# Patient Record
Sex: Female | Born: 1950 | Race: White | Hispanic: No | Marital: Married | Smoking: Never smoker
Health system: Southern US, Community
[De-identification: ages and names within clinical notes are randomized; demographics above are authoritative.]

## PROBLEM LIST (undated history)

## (undated) DIAGNOSIS — K219 Gastro-esophageal reflux disease without esophagitis: Secondary | ICD-10-CM

## (undated) DIAGNOSIS — O9989 Other specified diseases and conditions complicating pregnancy, childbirth and the puerperium: Secondary | ICD-10-CM

## (undated) DIAGNOSIS — E079 Disorder of thyroid, unspecified: Secondary | ICD-10-CM

## (undated) DIAGNOSIS — M329 Systemic lupus erythematosus, unspecified: Secondary | ICD-10-CM

## (undated) DIAGNOSIS — G4730 Sleep apnea, unspecified: Secondary | ICD-10-CM

## (undated) DIAGNOSIS — R112 Nausea with vomiting, unspecified: Secondary | ICD-10-CM

## (undated) DIAGNOSIS — Z9889 Other specified postprocedural states: Secondary | ICD-10-CM

## (undated) DIAGNOSIS — O99891 Other specified diseases and conditions complicating pregnancy: Secondary | ICD-10-CM

## (undated) DIAGNOSIS — H269 Unspecified cataract: Secondary | ICD-10-CM

## (undated) HISTORY — PX: CHOLECYSTECTOMY: SHX55

## (undated) HISTORY — PX: GASTRIC BYPASS: SHX52

## (undated) HISTORY — PX: COLONOSCOPY: SHX174

## (undated) HISTORY — PX: TUBAL LIGATION: SHX77

## (undated) HISTORY — PX: THYROIDECTOMY: SHX17

---

## 2013-11-10 ENCOUNTER — Other Ambulatory Visit (HOSPITAL_COMMUNITY): Payer: Self-pay

## 2013-11-10 ENCOUNTER — Other Ambulatory Visit (HOSPITAL_COMMUNITY): Payer: Self-pay | Admitting: Orthopaedic Surgery

## 2013-11-16 ENCOUNTER — Encounter (HOSPITAL_COMMUNITY): Payer: Self-pay | Admitting: Pharmacy Technician

## 2013-11-18 ENCOUNTER — Encounter (HOSPITAL_COMMUNITY): Payer: Self-pay

## 2013-11-18 ENCOUNTER — Encounter (HOSPITAL_COMMUNITY)
Admission: RE | Admit: 2013-11-18 | Discharge: 2013-11-18 | Disposition: A | Discharge status: Home / Self Care | Payer: BC Managed Care – PPO | Source: Ambulatory Visit | Attending: Orthopaedic Surgery | Admitting: Orthopaedic Surgery

## 2013-11-18 DIAGNOSIS — Z01812 Encounter for preprocedural laboratory examination: Secondary | ICD-10-CM | POA: Insufficient documentation

## 2013-11-18 DIAGNOSIS — Z01818 Encounter for other preprocedural examination: Secondary | ICD-10-CM | POA: Insufficient documentation

## 2013-11-18 HISTORY — DX: Disorder of thyroid, unspecified: E07.9

## 2013-11-18 HISTORY — DX: Unspecified cataract: H26.9

## 2013-11-18 HISTORY — DX: Other specified diseases and conditions complicating pregnancy: O99.891

## 2013-11-18 HISTORY — DX: Gastro-esophageal reflux disease without esophagitis: K21.9

## 2013-11-18 HISTORY — DX: Nausea with vomiting, unspecified: R11.2

## 2013-11-18 HISTORY — DX: Other specified diseases and conditions complicating pregnancy, childbirth and the puerperium: O99.89

## 2013-11-18 HISTORY — DX: Other specified postprocedural states: Z98.890

## 2013-11-18 HISTORY — DX: Systemic lupus erythematosus, unspecified: M32.9

## 2013-11-18 HISTORY — DX: Sleep apnea, unspecified: G47.30

## 2013-11-18 LAB — URINALYSIS, ROUTINE W REFLEX MICROSCOPIC
BILIRUBIN URINE: NEGATIVE
Glucose, UA: NEGATIVE mg/dL
Hgb urine dipstick: NEGATIVE
KETONES UR: NEGATIVE mg/dL
Leukocytes, UA: NEGATIVE
Nitrite: POSITIVE — AB
PH: 6 (ref 5.0–8.0)
Protein, ur: NEGATIVE mg/dL
Specific Gravity, Urine: 1.013 (ref 1.005–1.030)
Urobilinogen, UA: 1 mg/dL (ref 0.0–1.0)

## 2013-11-18 LAB — TYPE AND SCREEN
ABO/RH(D): A NEG
Antibody Screen: NEGATIVE

## 2013-11-18 LAB — BASIC METABOLIC PANEL
Anion gap: 13 (ref 5–15)
BUN: 13 mg/dL (ref 6–23)
CO2: 26 meq/L (ref 19–32)
Calcium: 9.2 mg/dL (ref 8.4–10.5)
Chloride: 103 mEq/L (ref 96–112)
Creatinine, Ser: 0.79 mg/dL (ref 0.50–1.10)
GFR calc Af Amer: >90 mL/min (ref >90)
GFR calc non Af Amer: 88 mL/min — ABNORMAL LOW (ref >90)
Glucose, Bld: 82 mg/dL (ref 70–99)
Potassium: 4.4 mEq/L (ref 3.7–5.3)
SODIUM: 142 meq/L (ref 137–147)

## 2013-11-18 LAB — CBC
HCT: 36 % (ref 36.0–46.0)
Hemoglobin: 11.7 g/dL — ABNORMAL LOW (ref 12.0–15.0)
MCH: 29.4 pg (ref 26.0–34.0)
MCHC: 32.5 g/dL (ref 30.0–36.0)
MCV: 90.5 fL (ref 78.0–100.0)
Platelets: 224 10*3/uL (ref 150–400)
RBC: 3.98 MIL/uL (ref 3.87–5.11)
RDW: 13.1 % (ref 11.5–15.5)
WBC: 8 10*3/uL (ref 4.0–10.5)

## 2013-11-18 LAB — URINE MICROSCOPIC-ADD ON

## 2013-11-18 LAB — APTT: aPTT: 35 seconds (ref 24–37)

## 2013-11-18 LAB — PROTIME-INR
INR: 1.06 (ref 0.00–1.49)
Prothrombin Time: 13.8 seconds (ref 11.6–15.2)

## 2013-11-18 LAB — SURGICAL PCR SCREEN
MRSA, PCR: NEGATIVE
Staphylococcus aureus: POSITIVE — AB

## 2013-11-18 LAB — ABO/RH: ABO/RH(D): A NEG

## 2013-11-18 NOTE — Progress Notes (Signed)
Pt denies SOB, chest pain, and being under the care of a cardiologist. Pt denies having a chest x ray and EKG within the last year. Pt denies having a stress test, echo, and cardiac cath. Pt PCP is Dr. Thresa RossPaul Bailey at [facility]. Spoke with Silva BandyKristi at Dr. Eliberto IvoryBlackman's office to make MD aware of abnormal UA.

## 2013-11-18 NOTE — Pre-Procedure Instructions (Addendum)
[name] Small  11/18/2013   Your procedure is scheduled on: Thursday, November 24, 2013  Report to [facility] Admitting at 8:00 AM.  Call this number if you have problems the morning of surgery: [phone]   Remember:   Do not eat food or drink liquids after midnight Wednesday, November 23, 2013   Take these medicines the morning of surgery with A SIP OF WATER: gabapentin (NEURONTIN),   hydroxychloroquine (PLAQUENIL), ranitidine (ZANTAC), synthroid Stop taking Aspirin, vitamins, and herbal medications. Do not take any NSAIDs ie: Ibuprofen, Advil, Naproxen or any medication containing Aspirin;stop 7 days prior to procedure.  Do not wear jewelry, make-up or nail polish.  Do not wear lotions, powders, or perfumes. You may wear deodorant.  Do not shave 48 hours prior to surgery.  Do not bring valuables to the hospital.  [facility] is not responsible for any belongings or valuables.               Contacts, dentures or bridgework may not be worn into surgery.  Leave suitcase in the car. After surgery it may be brought to your room.  For patients admitted to the hospital, discharge time is determined by your treatment team.               Patients discharged the day of surgery will not be allowed to drive home.  Name and phone number of your driver:   Special Instructions:  Special Instructions:Special Instructions: [facility] - Preparing for Surgery  Before surgery, you can play an important role.  Because skin is not sterile, your skin needs to be as free of germs as possible.  You can reduce the number of germs on you skin by washing with CHG (chlorahexidine gluconate) soap before surgery.  CHG is an antiseptic cleaner which kills germs and bonds with the skin to continue killing germs even after washing.  Please DO NOT use if you have an allergy to CHG or antibacterial soaps.  If your skin becomes reddened/irritated stop using the CHG and inform your nurse when you arrive at Short  Stay.  Do not shave (including legs and underarms) for at least 48 hours prior to the first CHG shower.  You may shave your face.  Please follow these instructions carefully:   1.  Shower with CHG Soap the night before surgery and the morning of Surgery.  2.  If you choose to wash your hair, wash your hair first as usual with your normal shampoo.  3.  After you shampoo, rinse your hair and body thoroughly to remove the Shampoo.  4.  Use CHG as you would any other liquid soap.  You can apply chg directly  to the skin and wash gently with scrungie or a clean washcloth.  5.  Apply the CHG Soap to your body ONLY FROM THE NECK DOWN.  Do not use on open wounds or open sores.  Avoid contact with your eyes, ears, mouth and genitals (private parts).  Wash genitals (private parts) with your normal soap.  6.  Wash thoroughly, paying special attention to the area where your surgery will be performed.  7.  Thoroughly rinse your body with warm water from the neck down.  8.  DO NOT shower/wash with your normal soap after using and rinsing off the CHG Soap.  9.  Pat yourself dry with a clean towel.            10.  Wear clean pajamas.  11.  Place clean sheets on your bed the night of your first shower and do not sleep with pets.  Day of Surgery  Do not apply any lotions/deoderants the morning of surgery.  Please wear clean clothes to the hospital/surgery center.   Please read over the following fact sheets that you were given: Pain Booklet, Coughing and Deep Breathing, Blood Transfusion Information, Total Joint Packet, MRSA Information and Surgical Site Infection Prevention

## 2013-11-23 MED ORDER — CEFAZOLIN SODIUM-DEXTROSE 2-3 GM-% IV SOLR
2.0000 g | INTRAVENOUS | Status: AC
  Administered 2013-11-24: 2 g via INTRAVENOUS
  Filled 2013-11-23: qty 50

## 2013-11-24 ENCOUNTER — Inpatient Hospital Stay (HOSPITAL_COMMUNITY): Payer: BC Managed Care – PPO

## 2013-11-24 ENCOUNTER — Inpatient Hospital Stay (HOSPITAL_COMMUNITY)
Admission: RE | Admit: 2013-11-24 | Discharge: 2013-11-28 | DRG: 470 | Disposition: A | Discharge status: Home / Self Care | Payer: BC Managed Care – PPO | Source: Ambulatory Visit | Attending: Orthopaedic Surgery | Admitting: Orthopaedic Surgery

## 2013-11-24 ENCOUNTER — Inpatient Hospital Stay (HOSPITAL_COMMUNITY): Payer: BC Managed Care – PPO | Admitting: Certified Registered Nurse Anesthetist

## 2013-11-24 ENCOUNTER — Encounter (HOSPITAL_COMMUNITY)
Admission: RE | Disposition: A | Discharge status: Home / Self Care | Payer: Self-pay | Source: Ambulatory Visit | Attending: Orthopaedic Surgery

## 2013-11-24 ENCOUNTER — Encounter (HOSPITAL_COMMUNITY): Payer: BC Managed Care – PPO | Admitting: Certified Registered Nurse Anesthetist

## 2013-11-24 ENCOUNTER — Encounter (HOSPITAL_COMMUNITY): Payer: Self-pay | Admitting: *Deleted

## 2013-11-24 DIAGNOSIS — D62 Acute posthemorrhagic anemia: Secondary | ICD-10-CM | POA: Diagnosis not present

## 2013-11-24 DIAGNOSIS — Z79899 Other long term (current) drug therapy: Secondary | ICD-10-CM

## 2013-11-24 DIAGNOSIS — M1612 Unilateral primary osteoarthritis, left hip: Secondary | ICD-10-CM

## 2013-11-24 DIAGNOSIS — Z96649 Presence of unspecified artificial hip joint: Secondary | ICD-10-CM

## 2013-11-24 DIAGNOSIS — M169 Osteoarthritis of hip, unspecified: Principal | ICD-10-CM | POA: Diagnosis present

## 2013-11-24 DIAGNOSIS — K219 Gastro-esophageal reflux disease without esophagitis: Secondary | ICD-10-CM | POA: Diagnosis present

## 2013-11-24 DIAGNOSIS — M1610 Unilateral primary osteoarthritis, unspecified hip: Principal | ICD-10-CM | POA: Diagnosis present

## 2013-11-24 DIAGNOSIS — Z6841 Body Mass Index (BMI) 40.0 and over, adult: Secondary | ICD-10-CM

## 2013-11-24 DIAGNOSIS — Z9884 Bariatric surgery status: Secondary | ICD-10-CM

## 2013-11-24 DIAGNOSIS — E079 Disorder of thyroid, unspecified: Secondary | ICD-10-CM | POA: Diagnosis present

## 2013-11-24 DIAGNOSIS — M329 Systemic lupus erythematosus, unspecified: Secondary | ICD-10-CM | POA: Diagnosis present

## 2013-11-24 HISTORY — PX: TOTAL HIP ARTHROPLASTY: SHX124

## 2013-11-24 SURGERY — ARTHROPLASTY, HIP, TOTAL, ANTERIOR APPROACH
Anesthesia: General | Site: Hip | Laterality: Left

## 2013-11-24 MED ORDER — METOCLOPRAMIDE HCL 5 MG/ML IJ SOLN: 5.0000 mg | Freq: Three times a day (TID) | INTRAMUSCULAR | Status: DC | PRN

## 2013-11-24 MED ORDER — ASPIRIN EC 325 MG PO TBEC
325.0000 mg | DELAYED_RELEASE_TABLET | Freq: Every day | ORAL | Status: DC
  Administered 2013-11-25 – 2013-11-28 (×4): 325 mg via ORAL
  Filled 2013-11-24 (×5): qty 1

## 2013-11-24 MED ORDER — SODIUM CHLORIDE 0.9 % IV SOLN
INTRAVENOUS | Status: DC
  Administered 2013-11-26: 06:00:00 via INTRAVENOUS

## 2013-11-24 MED ORDER — FERROUS SULFATE 325 (65 FE) MG PO TABS
325.0000 mg | ORAL_TABLET | Freq: Three times a day (TID) | ORAL | Status: DC
  Administered 2013-11-25 – 2013-11-28 (×9): 325 mg via ORAL
  Filled 2013-11-24 (×14): qty 1

## 2013-11-24 MED ORDER — LIDOCAINE HCL (CARDIAC) 20 MG/ML IV SOLN
INTRAVENOUS | Status: AC
  Filled 2013-11-24: qty 5

## 2013-11-24 MED ORDER — PROPOFOL 10 MG/ML IV BOLUS
INTRAVENOUS | Status: DC | PRN
  Administered 2013-11-24: 170 mg via INTRAVENOUS

## 2013-11-24 MED ORDER — LEVOTHYROXINE SODIUM 150 MCG PO TABS
150.0000 ug | ORAL_TABLET | ORAL | Status: DC
  Administered 2013-11-25 – 2013-11-28 (×3): 150 ug via ORAL
  Filled 2013-11-24 (×4): qty 1

## 2013-11-24 MED ORDER — LIDOCAINE HCL (CARDIAC) 20 MG/ML IV SOLN
INTRAVENOUS | Status: DC | PRN
  Administered 2013-11-24: 80 mg via INTRAVENOUS

## 2013-11-24 MED ORDER — PROPOFOL 10 MG/ML IV BOLUS
INTRAVENOUS | Status: AC
  Filled 2013-11-24: qty 20

## 2013-11-24 MED ORDER — OXYCODONE HCL 5 MG PO TABS
5.0000 mg | ORAL_TABLET | Freq: Once | ORAL | Status: AC | PRN
  Administered 2013-11-24: 5 mg via ORAL

## 2013-11-24 MED ORDER — DIPHENHYDRAMINE HCL 12.5 MG/5ML PO ELIX: 12.5000 mg | ORAL_SOLUTION | ORAL | Status: DC | PRN

## 2013-11-24 MED ORDER — HYDROMORPHONE HCL PF 1 MG/ML IJ SOLN
INTRAMUSCULAR | Status: AC
  Filled 2013-11-24: qty 1

## 2013-11-24 MED ORDER — ACETAMINOPHEN 650 MG RE SUPP: 650.0000 mg | Freq: Four times a day (QID) | RECTAL | Status: DC | PRN

## 2013-11-24 MED ORDER — HYDROXYCHLOROQUINE SULFATE 200 MG PO TABS
200.0000 mg | ORAL_TABLET | Freq: Two times a day (BID) | ORAL | Status: DC
  Administered 2013-11-25 – 2013-11-28 (×7): 200 mg via ORAL
  Filled 2013-11-24 (×10): qty 1

## 2013-11-24 MED ORDER — ESMOLOL HCL 10 MG/ML IV SOLN
INTRAVENOUS | Status: DC | PRN
  Administered 2013-11-24: 30 mg via INTRAVENOUS
  Administered 2013-11-24: 20 mg via INTRAVENOUS

## 2013-11-24 MED ORDER — MENTHOL 3 MG MT LOZG: 1.0000 | LOZENGE | OROMUCOSAL | Status: DC | PRN

## 2013-11-24 MED ORDER — MIDAZOLAM HCL 5 MG/5ML IJ SOLN
INTRAMUSCULAR | Status: DC | PRN
  Administered 2013-11-24: 2 mg via INTRAVENOUS

## 2013-11-24 MED ORDER — ARTIFICIAL TEARS OP OINT
TOPICAL_OINTMENT | OPHTHALMIC | Status: AC
  Filled 2013-11-24: qty 3.5

## 2013-11-24 MED ORDER — FENTANYL CITRATE 0.05 MG/ML IJ SOLN
INTRAMUSCULAR | Status: AC
  Filled 2013-11-24: qty 5

## 2013-11-24 MED ORDER — OXYCODONE HCL 5 MG PO TABS
5.0000 mg | ORAL_TABLET | ORAL | Status: DC | PRN
  Administered 2013-11-24 – 2013-11-27 (×14): 10 mg via ORAL
  Filled 2013-11-24 (×14): qty 2

## 2013-11-24 MED ORDER — METOPROLOL TARTRATE 1 MG/ML IV SOLN
INTRAVENOUS | Status: AC
  Filled 2013-11-24: qty 5

## 2013-11-24 MED ORDER — LEVOTHYROXINE SODIUM 75 MCG PO TABS
75.0000 ug | ORAL_TABLET | ORAL | Status: DC
  Administered 2013-11-27: 75 ug via ORAL
  Filled 2013-11-24: qty 1

## 2013-11-24 MED ORDER — ALUM & MAG HYDROXIDE-SIMETH 200-200-20 MG/5ML PO SUSP: 30.0000 mL | ORAL | Status: DC | PRN

## 2013-11-24 MED ORDER — OXYCODONE HCL 5 MG/5ML PO SOLN: 5.0000 mg | Freq: Once | ORAL | Status: AC | PRN

## 2013-11-24 MED ORDER — ONDANSETRON HCL 4 MG PO TABS
4.0000 mg | ORAL_TABLET | Freq: Four times a day (QID) | ORAL | Status: DC | PRN
  Administered 2013-11-27: 4 mg via ORAL
  Filled 2013-11-24: qty 1

## 2013-11-24 MED ORDER — ONDANSETRON HCL 4 MG/2ML IJ SOLN
INTRAMUSCULAR | Status: AC
  Filled 2013-11-24: qty 2

## 2013-11-24 MED ORDER — METHOCARBAMOL 500 MG PO TABS
500.0000 mg | ORAL_TABLET | Freq: Four times a day (QID) | ORAL | Status: DC | PRN
  Administered 2013-11-25 – 2013-11-27 (×6): 500 mg via ORAL
  Filled 2013-11-24 (×8): qty 1

## 2013-11-24 MED ORDER — NEOSTIGMINE METHYLSULFATE 10 MG/10ML IV SOLN
INTRAVENOUS | Status: AC
  Filled 2013-11-24: qty 1

## 2013-11-24 MED ORDER — MIDAZOLAM HCL 2 MG/2ML IJ SOLN
INTRAMUSCULAR | Status: AC
  Filled 2013-11-24: qty 2

## 2013-11-24 MED ORDER — PNEUMOCOCCAL VAC POLYVALENT 25 MCG/0.5ML IJ INJ
0.5000 mL | INJECTION | INTRAMUSCULAR | Status: AC
  Administered 2013-11-26: 0.5 mL via INTRAMUSCULAR
  Filled 2013-11-24: qty 0.5

## 2013-11-24 MED ORDER — HYDROMORPHONE HCL PF 1 MG/ML IJ SOLN
0.2500 mg | INTRAMUSCULAR | Status: DC | PRN
  Administered 2013-11-24 (×4): 0.5 mg via INTRAVENOUS

## 2013-11-24 MED ORDER — OXYCODONE HCL 5 MG PO TABS
ORAL_TABLET | ORAL | Status: AC
  Filled 2013-11-24: qty 2

## 2013-11-24 MED ORDER — ONDANSETRON HCL 4 MG/2ML IJ SOLN
4.0000 mg | Freq: Four times a day (QID) | INTRAMUSCULAR | Status: DC | PRN
  Administered 2013-11-24: 4 mg via INTRAVENOUS
  Filled 2013-11-24: qty 2

## 2013-11-24 MED ORDER — GLYCOPYRROLATE 0.2 MG/ML IJ SOLN
INTRAMUSCULAR | Status: AC
  Filled 2013-11-24: qty 3

## 2013-11-24 MED ORDER — ZOLPIDEM TARTRATE 5 MG PO TABS
5.0000 mg | ORAL_TABLET | Freq: Every evening | ORAL | Status: DC | PRN
  Filled 2013-11-24: qty 1

## 2013-11-24 MED ORDER — DOCUSATE SODIUM 100 MG PO CAPS
100.0000 mg | ORAL_CAPSULE | Freq: Two times a day (BID) | ORAL | Status: DC
  Administered 2013-11-25 – 2013-11-27 (×6): 100 mg via ORAL
  Filled 2013-11-24 (×9): qty 1

## 2013-11-24 MED ORDER — FAMOTIDINE 20 MG PO TABS
20.0000 mg | ORAL_TABLET | Freq: Two times a day (BID) | ORAL | Status: DC
  Administered 2013-11-25 – 2013-11-28 (×7): 20 mg via ORAL
  Filled 2013-11-24 (×10): qty 1

## 2013-11-24 MED ORDER — METOPROLOL TARTRATE 1 MG/ML IV SOLN
INTRAVENOUS | Status: DC | PRN
  Administered 2013-11-24: 1 mg via INTRAVENOUS
  Administered 2013-11-24: 2 mg via INTRAVENOUS

## 2013-11-24 MED ORDER — DEXAMETHASONE SODIUM PHOSPHATE 10 MG/ML IJ SOLN
INTRAMUSCULAR | Status: DC | PRN
  Administered 2013-11-24: 8 mg via INTRAVENOUS

## 2013-11-24 MED ORDER — ESMOLOL HCL 10 MG/ML IV SOLN
INTRAVENOUS | Status: AC
  Filled 2013-11-24: qty 10

## 2013-11-24 MED ORDER — LACTATED RINGERS IV SOLN
INTRAVENOUS | Status: DC
  Administered 2013-11-24: 09:00:00 via INTRAVENOUS

## 2013-11-24 MED ORDER — GLYCOPYRROLATE 0.2 MG/ML IJ SOLN
INTRAMUSCULAR | Status: DC | PRN
  Administered 2013-11-24: 0.4 mg via INTRAVENOUS

## 2013-11-24 MED ORDER — HYDROMORPHONE HCL PF 1 MG/ML IJ SOLN
1.0000 mg | INTRAMUSCULAR | Status: DC | PRN
  Administered 2013-11-24 – 2013-11-25 (×5): 1 mg via INTRAVENOUS
  Filled 2013-11-24 (×5): qty 1

## 2013-11-24 MED ORDER — PHENOL 1.4 % MT LIQD: 1.0000 | OROMUCOSAL | Status: DC | PRN

## 2013-11-24 MED ORDER — SODIUM CHLORIDE 0.9 % IR SOLN
Status: DC | PRN
  Administered 2013-11-24: 3000 mL

## 2013-11-24 MED ORDER — LACTATED RINGERS IV SOLN
INTRAVENOUS | Status: DC | PRN
  Administered 2013-11-24 (×3): via INTRAVENOUS

## 2013-11-24 MED ORDER — METHOCARBAMOL 500 MG PO TABS
ORAL_TABLET | ORAL | Status: AC
Start: 2013-11-24 — End: 2013-11-25
  Filled 2013-11-24: qty 1

## 2013-11-24 MED ORDER — SCOPOLAMINE 1 MG/3DAYS TD PT72
MEDICATED_PATCH | TRANSDERMAL | Status: DC | PRN
  Administered 2013-11-24: 1 via TRANSDERMAL

## 2013-11-24 MED ORDER — ROCURONIUM BROMIDE 100 MG/10ML IV SOLN
INTRAVENOUS | Status: DC | PRN
  Administered 2013-11-24: 50 mg via INTRAVENOUS

## 2013-11-24 MED ORDER — GABAPENTIN 600 MG PO TABS
600.0000 mg | ORAL_TABLET | Freq: Three times a day (TID) | ORAL | Status: DC
  Administered 2013-11-25 – 2013-11-28 (×10): 600 mg via ORAL
  Filled 2013-11-24 (×14): qty 1

## 2013-11-24 MED ORDER — NEOSTIGMINE METHYLSULFATE 10 MG/10ML IV SOLN
INTRAVENOUS | Status: DC | PRN
  Administered 2013-11-24: 3 mg via INTRAVENOUS

## 2013-11-24 MED ORDER — ACETAMINOPHEN 325 MG PO TABS
650.0000 mg | ORAL_TABLET | Freq: Four times a day (QID) | ORAL | Status: DC | PRN
  Administered 2013-11-25: 650 mg via ORAL
  Filled 2013-11-24: qty 2

## 2013-11-24 MED ORDER — OXYCODONE HCL 5 MG PO TABS
ORAL_TABLET | ORAL | Status: AC
  Filled 2013-11-24: qty 1

## 2013-11-24 MED ORDER — ONDANSETRON HCL 4 MG/2ML IJ SOLN: 4.0000 mg | Freq: Four times a day (QID) | INTRAMUSCULAR | Status: DC | PRN

## 2013-11-24 MED ORDER — ALBUMIN HUMAN 5 % IV SOLN
INTRAVENOUS | Status: DC | PRN
  Administered 2013-11-24 (×2): via INTRAVENOUS

## 2013-11-24 MED ORDER — ONDANSETRON HCL 4 MG/2ML IJ SOLN
INTRAMUSCULAR | Status: DC | PRN
  Administered 2013-11-24: 4 mg via INTRAVENOUS

## 2013-11-24 MED ORDER — POLYETHYLENE GLYCOL 3350 17 G PO PACK
17.0000 g | PACK | Freq: Every day | ORAL | Status: DC | PRN
  Administered 2013-11-26: 17 g via ORAL
  Filled 2013-11-24: qty 1

## 2013-11-24 MED ORDER — 0.9 % SODIUM CHLORIDE (POUR BTL) OPTIME
TOPICAL | Status: DC | PRN
  Administered 2013-11-24: 1000 mL

## 2013-11-24 MED ORDER — TRANEXAMIC ACID 100 MG/ML IV SOLN
1000.0000 mg | INTRAVENOUS | Status: AC
  Administered 2013-11-24: 1000 mg via INTRAVENOUS
  Filled 2013-11-24: qty 10

## 2013-11-24 MED ORDER — METHOCARBAMOL 1000 MG/10ML IJ SOLN
500.0000 mg | Freq: Four times a day (QID) | INTRAVENOUS | Status: DC | PRN
  Filled 2013-11-24: qty 5

## 2013-11-24 MED ORDER — FENTANYL CITRATE 0.05 MG/ML IJ SOLN
INTRAMUSCULAR | Status: DC | PRN
  Administered 2013-11-24 (×2): 50 ug via INTRAVENOUS
  Administered 2013-11-24 (×2): 100 ug via INTRAVENOUS
  Administered 2013-11-24 (×4): 50 ug via INTRAVENOUS

## 2013-11-24 MED ORDER — ROCURONIUM BROMIDE 50 MG/5ML IV SOLN
INTRAVENOUS | Status: AC
  Filled 2013-11-24: qty 1

## 2013-11-24 MED ORDER — ARTIFICIAL TEARS OP OINT
TOPICAL_OINTMENT | OPHTHALMIC | Status: DC | PRN
  Administered 2013-11-24: 1 via OPHTHALMIC

## 2013-11-24 MED ORDER — VITAMIN D3 25 MCG (1000 UNIT) PO TABS
1000.0000 [IU] | ORAL_TABLET | Freq: Two times a day (BID) | ORAL | Status: DC
  Administered 2013-11-25 – 2013-11-27 (×6): 1000 [IU] via ORAL
  Filled 2013-11-24 (×10): qty 1

## 2013-11-24 MED ORDER — METOCLOPRAMIDE HCL 10 MG PO TABS
5.0000 mg | ORAL_TABLET | Freq: Three times a day (TID) | ORAL | Status: DC | PRN
  Administered 2013-11-25: 10 mg via ORAL
  Filled 2013-11-24: qty 1

## 2013-11-24 MED ORDER — CEFAZOLIN SODIUM-DEXTROSE 2-3 GM-% IV SOLR
2.0000 g | Freq: Four times a day (QID) | INTRAVENOUS | Status: AC
  Administered 2013-11-24 (×2): 2 g via INTRAVENOUS
  Filled 2013-11-24 (×2): qty 50

## 2013-11-24 SURGICAL SUPPLY — 56 items
BANDAGE GAUZE ELAST BULKY 4 IN (GAUZE/BANDAGES/DRESSINGS) IMPLANT
BENZOIN TINCTURE PRP APPL 2/3 (GAUZE/BANDAGES/DRESSINGS) ×3 IMPLANT
BLADE SAW SGTL 18X1.27X75 (BLADE) ×2 IMPLANT
BLADE SAW SGTL 18X1.27X75MM (BLADE) ×1
BLADE SURG ROTATE 9660 (MISCELLANEOUS) IMPLANT
CAPT HIP PF COP ×3 IMPLANT
CELLS DAT CNTRL 66122 CELL SVR (MISCELLANEOUS) ×1 IMPLANT
CLOSURE WOUND 1/2 X4 (GAUZE/BANDAGES/DRESSINGS) ×2
COVER SURGICAL LIGHT HANDLE (MISCELLANEOUS) ×3 IMPLANT
CUP ACET PINNACLE SECTR 48MM (Joint) ×1 IMPLANT
DRAPE C-ARM 42X72 X-RAY (DRAPES) ×3 IMPLANT
DRAPE STERI IOBAN 125X83 (DRAPES) ×3 IMPLANT
DRAPE U-SHAPE 47X51 STRL (DRAPES) ×9 IMPLANT
DRSG AQUACEL AG ADV 3.5X10 (GAUZE/BANDAGES/DRESSINGS) ×3 IMPLANT
DURAPREP 26ML APPLICATOR (WOUND CARE) ×3 IMPLANT
ELECT BLADE 4.0 EZ CLEAN MEGAD (MISCELLANEOUS)
ELECT BLADE 6.5 EXT (BLADE) IMPLANT
ELECT CAUTERY BLADE 6.4 (BLADE) ×3 IMPLANT
ELECT REM PT RETURN 9FT ADLT (ELECTROSURGICAL) ×3
ELECTRODE BLDE 4.0 EZ CLN MEGD (MISCELLANEOUS) IMPLANT
ELECTRODE REM PT RTRN 9FT ADLT (ELECTROSURGICAL) ×1 IMPLANT
FACESHIELD WRAPAROUND (MASK) ×6 IMPLANT
GAUZE XEROFORM 1X8 LF (GAUZE/BANDAGES/DRESSINGS) ×3 IMPLANT
GLOVE BIOGEL PI IND STRL 8 (GLOVE) ×2 IMPLANT
GLOVE BIOGEL PI INDICATOR 8 (GLOVE) ×4
GLOVE ECLIPSE 8.0 STRL XLNG CF (GLOVE) ×3 IMPLANT
GLOVE ORTHO TXT STRL SZ7.5 (GLOVE) ×6 IMPLANT
GOWN STRL REUS W/ TWL XL LVL3 (GOWN DISPOSABLE) ×2 IMPLANT
GOWN STRL REUS W/TWL XL LVL3 (GOWN DISPOSABLE) ×4
HANDPIECE INTERPULSE COAX TIP (DISPOSABLE) ×2
KIT BASIN OR (CUSTOM PROCEDURE TRAY) ×3 IMPLANT
KIT ROOM TURNOVER OR (KITS) ×3 IMPLANT
MANIFOLD NEPTUNE II (INSTRUMENTS) ×3 IMPLANT
NS IRRIG 1000ML POUR BTL (IV SOLUTION) ×3 IMPLANT
PACK TOTAL JOINT (CUSTOM PROCEDURE TRAY) ×3 IMPLANT
PAD ARMBOARD 7.5X6 YLW CONV (MISCELLANEOUS) ×6 IMPLANT
PINN ALTRX NEUT ID X OD 32X48 ×3 IMPLANT
PINNSECTOR W/GRIP ACE CUP 48MM (Joint) ×3 IMPLANT
RTRCTR WOUND ALEXIS 18CM MED (MISCELLANEOUS) ×3
SET HNDPC FAN SPRY TIP SCT (DISPOSABLE) ×1 IMPLANT
SPONGE LAP 18X18 X RAY DECT (DISPOSABLE) IMPLANT
SPONGE LAP 4X18 X RAY DECT (DISPOSABLE) IMPLANT
STAPLER VISISTAT 35W (STAPLE) ×3 IMPLANT
STRIP CLOSURE SKIN 1/2X4 (GAUZE/BANDAGES/DRESSINGS) ×4 IMPLANT
SUT ETHIBOND NAB CT1 #1 30IN (SUTURE) ×3 IMPLANT
SUT MNCRL AB 4-0 PS2 18 (SUTURE) ×3 IMPLANT
SUT VIC AB 0 CT1 27 (SUTURE) ×2
SUT VIC AB 0 CT1 27XBRD ANBCTR (SUTURE) ×1 IMPLANT
SUT VIC AB 1 CT1 27 (SUTURE) ×2
SUT VIC AB 1 CT1 27XBRD ANBCTR (SUTURE) ×1 IMPLANT
SUT VIC AB 2-0 CT1 27 (SUTURE) ×4
SUT VIC AB 2-0 CT1 TAPERPNT 27 (SUTURE) ×2 IMPLANT
TOWEL OR 17X24 6PK STRL BLUE (TOWEL DISPOSABLE) ×3 IMPLANT
TOWEL OR 17X26 10 PK STRL BLUE (TOWEL DISPOSABLE) ×3 IMPLANT
TRAY FOLEY CATH 16FRSI W/METER (SET/KITS/TRAYS/PACK) IMPLANT
WATER STERILE IRR 1000ML POUR (IV SOLUTION) ×6 IMPLANT

## 2013-11-24 NOTE — Anesthesia Preprocedure Evaluation (Signed)
Anesthesia Evaluation  Patient identified by MRN, date of birth, ID band Patient awake    Reviewed: Allergy & Precautions, H&P , NPO status , Patient's Chart, lab work & pertinent test results  Airway Mallampati: II  Neck ROM: full    Dental   Pulmonary sleep apnea ,          Cardiovascular negative cardio ROS      Neuro/Psych    GI/Hepatic GERD-  ,  Endo/Other  Morbid obesity  Renal/GU      Musculoskeletal   Abdominal   Peds  Hematology   Anesthesia Other Findings   Reproductive/Obstetrics                           Anesthesia Physical Anesthesia Plan  ASA: II  Anesthesia Plan: General   Post-op Pain Management:    Induction: Intravenous  Airway Management Planned: Oral ETT  Additional Equipment:   Intra-op Plan:   Post-operative Plan: Extubation in OR  Informed Consent: I have reviewed the patients History and Physical, chart, labs and discussed the procedure including the risks, benefits and alternatives for the proposed anesthesia with the patient or authorized representative who has indicated his/her understanding and acceptance.     Plan Discussed with: CRNA, Anesthesiologist and Surgeon  Anesthesia Plan Comments:         Anesthesia Quick Evaluation

## 2013-11-24 NOTE — Anesthesia Postprocedure Evaluation (Signed)
Anesthesia Post Note  Patient: [name] Small  Procedure(s) Performed: Procedure(s) (LRB): LEFT TOTAL HIP ARTHROPLASTY ANTERIOR APPROACH (Left)  Anesthesia type: General  Patient location: PACU  Post pain: Pain level controlled and Adequate analgesia  Post assessment: Post-op Vital signs reviewed, Patient's Cardiovascular Status Stable, Respiratory Function Stable, Patent Airway and Pain level controlled  Last Vitals:  Filed Vitals:   11/24/13 1515  BP: 126/80  Pulse: 81  Temp:   Resp: 14    Post vital signs: Reviewed and stable  Level of consciousness: awake, alert  and oriented  Complications: Pt went into AF while in OR.  Rate became as high as 130.  Spontaneously resolved and went back to NSR.  3mg  Metoprolol was given to help prevent further episodes of AF with RVR.

## 2013-11-24 NOTE — Progress Notes (Signed)
Last medications of the day held d/t nausea

## 2013-11-24 NOTE — H&P (Signed)
TOTAL HIP ADMISSION H&P  Patient is admitted for left total hip arthroplasty.  Subjective:  Chief Complaint: left hip pain  HPI: [name] Small, 63 y.o. female, has a history of pain and functional disability in the left hip(s) due to arthritis and patient has failed non-surgical conservative treatments for greater than 12 weeks to include NSAID's and/or analgesics, corticosteriod injections, use of assistive devices, weight reduction as appropriate and activity modification.  Onset of symptoms was gradual starting 2 years ago with gradually worsening course since that time.The patient noted no past surgery on the left hip(s).  Patient currently rates pain in the left hip at 10 out of 10 with activity. Patient has night pain, worsening of pain with activity and weight bearing, trendelenberg gait, pain that interfers with activities of daily living, pain with passive range of motion and crepitus. Patient has evidence of subchondral cysts, subchondral sclerosis, periarticular osteophytes and joint space narrowing by imaging studies. This condition presents safety issues increasing the risk of falls.  There is no current active infection.  Patient Active Problem List   Diagnosis Date Noted  . Arthritis of left hip 11/24/2013   Past Medical History  Diagnosis Date  . PONV (postoperative nausea and vomiting)   . Thyroid disease   . Lupus (systemic lupus erythematosus)   . Cataract     premature  . Sleep apnea     PMH prior to gastric Bypass  . GERD (gastroesophageal reflux disease)   . Blood transfusion complicating pregnancy     Past Surgical History  Procedure Laterality Date  . Thyroidectomy    . Cesarean section      x 2  . Cholecystectomy    . Colonoscopy    . Gastric bypass    . Tubal ligation      Prescriptions prior to admission  Medication Sig Dispense Refill  . cholecalciferol (VITAMIN D) 1000 UNITS tablet Take 1,000 Units by mouth 2 (two) times daily.      Marland Kitchen. gabapentin  (NEURONTIN) 600 MG tablet Take 600 mg by mouth 3 (three) times daily.      . hydroxychloroquine (PLAQUENIL) 200 MG tablet Take 200 mg by mouth 2 (two) times daily.      Marland Kitchen. levothyroxine (SYNTHROID, LEVOTHROID) 150 MCG tablet Take 150 mcg by mouth daily before breakfast. Monday -Saturday      . levothyroxine (SYNTHROID, LEVOTHROID) 75 MCG tablet Take 75 mcg by mouth daily before breakfast. On Sunday only      . OVER THE COUNTER MEDICATION Take 1 tablet by mouth 2 (two) times daily. Hair and Nail Vitamin      . ranitidine (ZANTAC) 150 MG tablet Take 150 mg by mouth 2 (two) times daily.       No Known Allergies  History  Substance Use Topics  . Smoking status: Never Smoker   . Smokeless tobacco: Never Used  . Alcohol Use: No    Family History  Problem Relation Age of Onset  . Hypertension Brother   . Arthritis Other   . Arthritis/Rheumatoid Other   . Cancer - Lung Brother      Review of Systems  Musculoskeletal: Positive for joint pain.  All other systems reviewed and are negative.   Objective:  Physical Exam  Constitutional: She is oriented to person, place, and time. She appears well-developed and well-nourished.  HENT:  Head: Normocephalic and atraumatic.  Eyes: EOM are normal. Pupils are equal, round, and reactive to light.  Neck: Normal range of motion. Neck  supple.  Cardiovascular: Normal rate and regular rhythm.   Respiratory: Effort normal and breath sounds normal.  GI: Soft. Bowel sounds are normal.  Musculoskeletal:       Left hip: She exhibits decreased range of motion, decreased strength, bony tenderness and crepitus.  Neurological: She is alert and oriented to person, place, and time.  Skin: Skin is warm and dry.  Psychiatric: She has a normal mood and affect.    Vital signs in last 24 hours: Temp:  [97.6 F (36.4 C)] 97.6 F (36.4 C) (07/23 0832) Pulse Rate:  [61] 61 (07/23 0832) Resp:  [20] 20 (07/23 0832) BP: (148)/(64) 148/64 mmHg (07/23 0832) SpO2:   [100 %] 100 % (07/23 0832)  Labs:   There is no weight on file to calculate BMI.   Imaging Review Plain radiographs demonstrate severe degenerative joint disease of the left hip(s). The bone quality appears to be good for age and reported activity level.  Assessment/Plan:  End stage arthritis, left hip(s)  The patient history, physical examination, clinical judgement of the provider and imaging studies are consistent with end stage degenerative joint disease of the left hip(s) and total hip arthroplasty is deemed medically necessary. The treatment options including medical management, injection therapy, arthroscopy and arthroplasty were discussed at length. The risks and benefits of total hip arthroplasty were presented and reviewed. The risks due to aseptic loosening, infection, stiffness, dislocation/subluxation,  thromboembolic complications and other imponderables were discussed.  The patient acknowledged the explanation, agreed to proceed with the plan and consent was signed. Patient is being admitted for inpatient treatment for surgery, pain control, PT, OT, prophylactic antibiotics, VTE prophylaxis, progressive ambulation and ADL's and discharge planning.The patient is planning to be discharged home with home health services

## 2013-11-24 NOTE — Brief Op Note (Signed)
11/24/2013  1:33 PM  PATIENT:  [name] Small  63 y.o. female  PRE-OPERATIVE DIAGNOSIS:  Severe osteoarthritis left hip  POST-OPERATIVE DIAGNOSIS:  Severe osteoarthritis left hip  PROCEDURE:  Procedure(s): LEFT TOTAL HIP ARTHROPLASTY ANTERIOR APPROACH (Left)  SURGEON:  Surgeon(s) and Role:    * Kathryne Hitchhristopher Y Naser Schuld, MD - Primary  PHYSICIAN ASSISTANT: Rexene EdisonGil Clark, PA-C  ANESTHESIA:   general  EBL:  Total I/O In: 2500 [I.V.:2000; IV Piggyback:500] Out: 950 [Urine:200; Blood:750]  BLOOD ADMINISTERED:none  DRAINS: none   LOCAL MEDICATIONS USED:  NONE  SPECIMEN:  No Specimen  DISPOSITION OF SPECIMEN:  N/A  COUNTS:  YES  TOURNIQUET:  * No tourniquets in log *  DICTATION: .Other Dictation: Dictation Number [id number]  PLAN OF CARE: Admit to inpatient   PATIENT DISPOSITION:  PACU - hemodynamically stable.   Delay start of Pharmacological VTE agent (>24hrs) due to surgical blood loss or risk of bleeding: no

## 2013-11-24 NOTE — Progress Notes (Signed)
Report given to robin rn as caregiver 

## 2013-11-24 NOTE — Transfer of Care (Signed)
Immediate Anesthesia Transfer of Care Note  Patient: [name] Small  Procedure(s) Performed: Procedure(s): LEFT TOTAL HIP ARTHROPLASTY ANTERIOR APPROACH (Left)  Patient Location: PACU  Anesthesia Type:General  Level of Consciousness: awake, alert , oriented and patient cooperative  Airway & Oxygen Therapy: Patient Spontanous Breathing and Patient connected to nasal cannula oxygen  Post-op Assessment: Report given to PACU RN, Post -op Vital signs reviewed and stable and Patient moving all extremities X 4  Post vital signs: Reviewed and stable  Complications: No apparent anesthesia complications

## 2013-11-25 ENCOUNTER — Encounter (HOSPITAL_COMMUNITY): Payer: Self-pay | Admitting: Orthopaedic Surgery

## 2013-11-25 LAB — BASIC METABOLIC PANEL
Anion gap: 11 (ref 5–15)
BUN: 11 mg/dL (ref 6–23)
CO2: 25 mEq/L (ref 19–32)
Calcium: 8.3 mg/dL — ABNORMAL LOW (ref 8.4–10.5)
Chloride: 101 mEq/L (ref 96–112)
Creatinine, Ser: 0.77 mg/dL (ref 0.50–1.10)
GFR calc Af Amer: >90 mL/min (ref >90)
GFR, EST NON AFRICAN AMERICAN: 89 mL/min — AB (ref >90)
GLUCOSE: 128 mg/dL — AB (ref 70–99)
Potassium: 3.9 mEq/L (ref 3.7–5.3)
Sodium: 137 mEq/L (ref 137–147)

## 2013-11-25 LAB — CBC
HEMATOCRIT: 28.7 % — AB (ref 36.0–46.0)
HEMOGLOBIN: 9.4 g/dL — AB (ref 12.0–15.0)
MCH: 29.5 pg (ref 26.0–34.0)
MCHC: 32.8 g/dL (ref 30.0–36.0)
MCV: 90 fL (ref 78.0–100.0)
Platelets: 190 10*3/uL (ref 150–400)
RBC: 3.19 MIL/uL — ABNORMAL LOW (ref 3.87–5.11)
RDW: 13 % (ref 11.5–15.5)
WBC: 15.1 10*3/uL — ABNORMAL HIGH (ref 4.0–10.5)

## 2013-11-25 NOTE — Plan of Care (Signed)
Problem: Consults Goal: Diagnosis- Total Joint Replacement Primary Total Hip Left     

## 2013-11-25 NOTE — Progress Notes (Signed)
Utilization review completed. Carling Liberman, RN, BSN. 

## 2013-11-25 NOTE — Progress Notes (Signed)
Unable to given IV Dilaudid, due to iv infiltration. @nd  nurse attempted to restart. IV team called.

## 2013-11-25 NOTE — Evaluation (Signed)
I have read and agree with this note.   Time in/out: [id number] Total time: 38 minutes (Ev, 2SC)  Ignacia Palmaathy Laquincy Eastridge, OTR/L [phone]

## 2013-11-25 NOTE — Care Management Note (Signed)
CARE MANAGEMENT NOTE 11/25/2013  Patient:  [name] Small,[name] Small   Account Number:  [id number]  Date Initiated:  11/25/2013  Documentation initiated by:  Vance PeperBRADY,Jishnu Jenniges  Subjective/Objective Assessment:   63 yr old female s/p left total hip arthroplasty.     Action/Plan:   Patient will [facility]. CM notified Child psychotherapistsocial worker. [place]   Anticipated DC Date:  11/28/2013   Anticipated DC Plan:  SKILLED NURSING FACILITY  In-house referral  Clinical Social Worker      DC Planning Services  CM consult      Choice offered to / List presented to:             Status of service:  In process, will continue to follow Medicare Important Message given?   (If response is "NO", the following Medicare IM given date fields will be blank) Date Medicare IM given:   Medicare IM given by:   Date Additional Medicare IM given:   Additional Medicare IM given by:    Discharge Disposition:  SKILLED NURSING FACILITY

## 2013-11-25 NOTE — Op Note (Signed)
[name] Small, [name] Small NO.:  [id number]  MEDICAL RECORD NO.:  [id number]  LOCATION:  [id number]                        FACILITY:  [facility]  PHYSICIAN:  Vanita Panda. Magnus Ivan, M.D.DATE OF BIRTH:  August 24, 1950  DATE OF PROCEDURE:  11/24/2013 DATE OF DISCHARGE:                              OPERATIVE REPORT   PREOPERATIVE DIAGNOSIS:  Severe end-stage arthritis and degenerative joint disease, left hip with some protrusio.  POSTOPERATIVE DIAGNOSIS:  Severe end-stage arthritis and degenerative joint disease, left hip with some protrusio.  PROCEDURE:  Left total hip arthroplasty through direct anterior approach.  IMPLANTS:  DePuy Sector Gription acetabular component size 50 with apex hole eliminator guide and 2 screws, size 32+ 0 neutral polyethylene liner, size 12 Corail femoral component with varus offset, size 32+1 ceramic hip ball.  SURGEON:  Doneen Poisson, MD.  ASSISTANT:  Richardean Canal, PA-C  ANESTHESIA:  General.  ANTIBIOTICS:  2 g IV Ancef.  BLOOD LOSS:  750 mL.  COMPLICATIONS:  Questionable atrial fibrillation during the case.  INDICATIONS:  [name] Small is a 63 year old female with known severe degenerative joint disease involving her left hip. The x-ray show even protrusio of the hip with loss of cartilage completely and loss of joint space.  She has severe pain with internal and external rotation and it is affecting her activities of daily living including driving, walking, getting into the bathroom, and getting out of bed.  She understands the goal of surgery is decreased pain, improved mobility, and overall improved quality of life.  She understands her risks are acute blood loss anemia, nerve and vessel injury, fracture,  infection, dislocation, and DVT.  PROCEDURE DESCRIPTION:  After informed consent was obtained, appropriate left hip was marked.  She was brought to the operating placed and placed upon the operating table.  General anesthesia  was then obtained, and she was next placed supine on the Hana fracture table with perineal post in place and both legs in an inline skeletal traction boots with no traction applied.  Her left operative hip was then prepped and draped with DuraPrep and sterile drapes.  Time-out was called to identify correct patient, correct left hip.  I then made incision over the anterior-superior iliac spine just inferior and posterior to this, dissected down the soft tissue to the tensor fascia lata.  The tensor fascia was was divided longitudinally, so it could proceed with a direct anterior approach to the hip.  It was certainly difficult due to her degree of adipose tissue and with her weighing over 240 pounds.  I was able to dissect down to the hip joint and identify lateral femoral circumflex vessels which were cauterized.  We placed Cobra retractors around the medial and lateral neck and then opened up the hip capsule in a L-type format exposing the femoral neck and head and finding a large joint effusion.  I then made my femoral neck cut just proximal to lesser trochanter with an oscillating saw and completed this with an osteotome. I placed a corkscrew guide in the femoral head and removed the femoral head in its entirety.  We appeared into the acetabulum labrum and found significant sclerosis with her being significantly medialized.  We  then began reaming from a size 42 reamer up to a size 48 with all reamers under direct visualization, last reamer also under direct fluoroscopy. I was pleased with the size 48, which I felt at that time and I placed a real 48 DePuy sector Gription acetabular component, apex eliminator guide, and single screw.  I then placed a 32+ 4 neutral polyethylene liner.  Attention was then turned to the femur.  With the leg externally rotated to 100 degrees, extended and adducted, I was able to gain access to the femoral canal with a rongeur and a box cutting osteotome.  I  then began broaching up to a size 12 broach from 8-12 using the Corail femoral broaching system with 12 in place and I placed a varus offset neck and a 32+ 1 hip ball.  We had significant trouble trying to reduce her.  She was a little short preoperative, but with the placement of the acetabular component, I felt that we had not medialized her much and with a 32+ 4 polyethylene liner that may have gotten her too much length.  We were finally able to reduce it and it showed her she was significantly longer.  I dislocated the hip and removed the trial stem. I reassessed the acetabulum and thought that I would go with a neutral liner,  I took out the already 32+ 4 liner we put in place and then felt the acetabular component was actually loose, so I removed the apex hole eliminator and the single screw.  I placed reamers back in from a size 44 in 2 mm increments, actually reamed up back to a size 15. We got a little bit more aggressive on the sclerotic bone that she had.  I then decided to place the size 50 acetabular component.  We did this, the apex hole eliminator guide and 2 screws.  I then placed a 32+ 0 to neutral polyethylene liner. We went back to the femur and since we had already done the broaching, we placed the real size 12 Corail femoral component and a 32+ 1 ceramic hip ball, reduced this and acetabulum was stable throughout its arc of motion.  Her leg lengths and offsets were near equal.  We then copiously irrigated the soft tissues with normal saline solution, closed the joint capsule with interrupted #1 Ethibond suture, followed by a running #1 Vicryl in the tensor fascia, 0 Vicryl in deep tissue, 2-0 Vicryl in subcutaneous tissue, staples on the skin, and Aquacel with dressing.  She was awakened, extubated. and taken to recovery room in stable condition.  Postoperatively, in the recovery room, we will obtain the EKG to assess if there was truly some atrial fibrillation that  was noted.  I did talk to her family about this and they do have a family history of that and her mother has had quite a problem with it.  If this is the case, we will get Cardiology involved as well.  Of note, Richardean CanalGilbert Clark, PA-C assisted in the entire case.  His assistance was crucial for this case.     Vanita Pandahristopher Y. Magnus IvanBlackman, M.D.     CYB/MEDQ  D:  11/24/2013  T:  11/24/2013  Job:  [id number]

## 2013-11-25 NOTE — Progress Notes (Signed)
Subjective: 1 Day Post-Op Procedure(s) (LRB): LEFT TOTAL HIP ARTHROPLASTY ANTERIOR APPROACH (Left) Patient reports pain as moderate.  Acute blood loss anemia from surgery - will monitor.  Objective: Vital signs in last 24 hours: Temp:  [97.2 F (36.2 C)-98 F (36.7 C)] 97.9 F (36.6 C) (07/24 0354) Pulse Rate:  [61-96] 79 (07/24 0354) Resp:  [5-26] 16 (07/24 0400) BP: (119-148)/(43-89) 121/43 mmHg (07/24 0354) SpO2:  [94 %-100 %] 99 % (07/24 0400)  Intake/Output from previous day: 07/23 0701 - 07/24 0700 In: 2500 [I.V.:2000; IV Piggyback:500] Out: 1750 [Urine:1000; Blood:750] Intake/Output this shift: Total I/O In: -  Out: 800 [Urine:800]   Recent Labs  11/25/13 0600  HGB 9.4*    Recent Labs  11/25/13 0600  WBC 15.1*  RBC 3.19*  HCT 28.7*  PLT 190   No results found for this basename: NA, K, CL, CO2, BUN, CREATININE, GLUCOSE, CALCIUM,  in the last 72 hours No results found for this basename: LABPT, INR,  in the last 72 hours  Sensation intact distally Intact pulses distally Dorsiflexion/Plantar flexion intact Incision: scant drainage  Assessment/Plan: 1 Day Post-Op Procedure(s) (LRB): LEFT TOTAL HIP ARTHROPLASTY ANTERIOR APPROACH (Left) Up with therapy  [place] Curl Y 11/25/2013, 6:56 AM

## 2013-11-25 NOTE — Evaluation (Signed)
Physical Therapy Evaluation Patient Details Name: [name] Small MRN: [id number] DOB: 1950-07-30 Today's Date: 11/25/2013   History of Present Illness  63 y.o. female s/p left total hip arthroplasty.  Hx of lupus, GERD, and thyroid disease.  Clinical Impression  Pt is s/p left THA via direct anterior approach and presents with the deficits listed below (see PT Problem List). Ambulatory distance limited by pain this AM but demonstrating good stability with rolling walker. Mod assist with bed mobility upon evaluation. Pt will benefit from skilled PT to increase their independence and safety with mobility to allow discharge to the venue listed below.        Follow Up Recommendations Home health PT;Supervision for mobility/OOB    Equipment Recommendations  Rolling walker with 5" wheels;3in1 (PT)    Recommendations for Other Services OT consult     Precautions / Restrictions Precautions Precautions: None Precaution Comments: Direct anterior approach - no precautions Restrictions Weight Bearing Restrictions: Yes LLE Weight Bearing: Weight bearing as tolerated      Mobility  Bed Mobility Overal bed mobility: Needs Assistance Bed Mobility: Supine to Sit     Supine to sit: Mod assist     General bed mobility comments: Mod assist for scoot to edge of bed. VC for technique. Uses bed rail heavily.  Transfers Overall transfer level: Needs assistance Equipment used: Rolling walker (2 wheeled) Transfers: Sit to/from Stand Sit to Stand: Min guard;From elevated surface         General transfer comment: Min guard for safety. Took 3 attempts but able to finially stand without physical assist. VC for hand placement. Bed elevated.  Ambulation/Gait Ambulation/Gait assistance: Min guard Ambulation Distance (Feet): 5 Feet Assistive device: Rolling walker (2 wheeled) Gait Pattern/deviations: Step-to pattern;Decreased step length - right;Decreased stance time - left;Antalgic   Gait  velocity interpretation: Below normal speed for age/gender General Gait Details: Educated on safe DME use with rolling walker. VC for sequencing and for more LLE clearance with left step. Limited distance due to pain.  Stairs            Wheelchair Mobility    Modified Rankin (Stroke Patients Only)       Balance Overall balance assessment: Needs assistance Sitting-balance support: No upper extremity supported;Feet supported Sitting balance-Leahy Scale: Good     Standing balance support: No upper extremity supported Standing balance-Leahy Scale: Fair                               Pertinent Vitals/Pain 8/10 pain Nurse administered pain medication at start of therapy session Patient repositioned in chair for comfort.     Home Living Family/patient expects to be discharged to:: Private residence Living Arrangements: Spouse/significant other;Parent Available Help at Discharge: Family;Available 24 hours/day Type of Home: Mobile home Home Access: Ramped entrance     Home Layout: One level Home Equipment: Cane - single point      Prior Function Level of Independence: Independent with assistive device(s)         Comments: Used cane for ambulation     Hand Dominance   Dominant Hand: Right    Extremity/Trunk Assessment   Upper Extremity Assessment: Defer to OT evaluation           Lower Extremity Assessment: LLE deficits/detail   LLE Deficits / Details: decreased strength and ROM as expected post -op     Communication   Communication: No difficulties  Cognition Arousal/Alertness: Awake/alert Behavior During  Therapy: WFL for tasks assessed/performed Overall Cognitive Status: Within Functional Limits for tasks assessed                      General Comments      Exercises Total Joint Exercises Ankle Circles/Pumps: AROM;Both;10 reps;Seated Quad Sets: Both;10 reps;Seated;Strengthening Gluteal Sets: Strengthening;Both;10  reps;Seated      Assessment/Plan    PT Assessment Patient needs continued PT services  PT Diagnosis Difficulty walking;Abnormality of gait;Acute pain   PT Problem List Decreased strength;Decreased range of motion;Decreased activity tolerance;Decreased balance;Decreased mobility;Decreased knowledge of use of DME;Obesity;Pain  PT Treatment Interventions DME instruction;Gait training;Functional mobility training;Therapeutic activities;Therapeutic exercise;Balance training;Neuromuscular re-education;Patient/family education;Modalities   PT Goals (Current goals can be found in the Care Plan section) Acute Rehab PT Goals Patient Stated Goal: Go home - retire in december PT Goal Formulation: With patient Time For Goal Achievement: 12/02/13 Potential to Achieve Goals: Good    Frequency 7X/week   Barriers to discharge        Co-evaluation               End of Session   Activity Tolerance: Patient limited by pain Patient left: in chair;with call bell/phone within reach;with family/visitor present Nurse Communication: Mobility status         Time: 0981-1914 PT Time Calculation (min): 29 min   Charges:   PT Evaluation $Initial PT Evaluation Tier I: 1 Procedure PT Treatments $Therapeutic Activity: 8-22 mins   PT G Codes:         Charlsie Merles, [place] [phone]  Berton Mount 11/25/2013, 10:14 AM

## 2013-11-25 NOTE — Progress Notes (Signed)
Physical Therapy Treatment Patient Details Name: [name] Small MRN: [id number] DOB: 1950-08-18 Today'Small Date: 11/25/2013    History of Present Illness 62 y.o. female Small/p left total hip arthroplasty.  Hx of lupus, GERD, and thyroid disease.    PT Comments    Pt progressing slowly towards physical therapy goals, ambulating up to 50 feet with rolling walker at min guard assist and required one standing rest break. Was informed by case manager that pt will not be able to receive home health PT due to the location of her home. Thus, have updated d/c recommendation to short term SNF stay in order to continue therapies for best possible outcomes with recovery and return of functional independence.   Follow Up Recommendations  Supervision for mobility/OOB;SNF     Equipment Recommendations  Rolling walker with 5" wheels;3in1 (PT)    Recommendations for Other Services OT consult     Precautions / Restrictions Precautions Precautions: None Precaution Comments: Direct anterior approach - no precautions Restrictions Weight Bearing Restrictions: Yes LLE Weight Bearing: Weight bearing as tolerated    Mobility  Bed Mobility               General bed mobility comments: Pt up in chair upon OT tx.  Transfers Overall transfer level: Needs assistance Equipment used: Rolling walker (2 wheeled) Transfers: Sit to/from Stand Sit to Stand: Min guard         General transfer comment: Min guard for safety with VC for hand placement. Good stability upon standing while holding rolling walker. Requires extra time. Decreased WB through LLE.  Ambulation/Gait Ambulation/Gait assistance: Min guard Ambulation Distance (Feet): 50 Feet Assistive device: Rolling walker (2 wheeled) Gait Pattern/deviations: Step-to pattern;Decreased step length - right;Decreased stance time - left;Antalgic   Gait velocity interpretation: Below normal speed for age/gender General Gait Details: Focused on hip flexion  with left step. Instructed to maintain forward gaze and for left hip extension during left stance phase for glute activation. Required one standing rest break during 50 foot bout.   Stairs            Wheelchair Mobility    Modified Rankin (Stroke Patients Only)       Balance Overall balance assessment: Needs assistance Sitting-balance support: No upper extremity supported;Feet supported Sitting balance-Leahy Scale: Good     Standing balance support: No upper extremity supported Standing balance-Leahy Scale: Fair                      Cognition Arousal/Alertness: Awake/alert Behavior During Therapy: WFL for tasks assessed/performed Overall Cognitive Status: Within Functional Limits for tasks assessed                      Exercises Total Joint Exercises Ankle Circles/Pumps: AROM;Both;10 reps;Seated Quad Sets: Both;10 reps;Seated;Strengthening Short Arc Quad: Left;AAROM;10 reps;Seated Heel Slides: AAROM;Left;10 reps;Supine Long Arc Quad: Strengthening;Both;10 reps;Seated    General Comments        Pertinent Vitals/Pain Pt reports "soreness" no numerical value for pain given. States she will call for pain medicine soon, as needed. Patient repositioned in chair for comfort.     Home Living Family/patient expects to be discharged to:: Private residence                    Prior Function Level of Independence: Needs assistance    ADL'Small / Homemaking Assistance Needed: husband has helped with LLE dressing recently Comments: Used cane for ambulation   PT Goals (current goals  can now be found in the care plan section) Acute Rehab PT Goals Patient Stated Goal: Go home - retire in december PT Goal Formulation: With patient Time For Goal Achievement: 12/02/13 Potential to Achieve Goals: Good Progress towards PT goals: Progressing toward goals    Frequency  7X/week    PT Plan Discharge plan needs to be updated    Co-evaluation              End of Session   Activity Tolerance: Patient tolerated treatment well Patient left: in chair;with call bell/phone within reach;with family/visitor present     Time: 1430-1457 PT Time Calculation (min): 27 min  Charges:  $Gait Training: 8-22 mins $Therapeutic Exercise: 8-22 mins                    G Codes:      [employer] Kyran Small, [place] [phone]  Berton MountBarbour, [name] Small 11/25/2013, 4:25 PM

## 2013-11-25 NOTE — Evaluation (Signed)
Occupational Therapy Evaluation Patient Details Name: [name] Small MRN: [id number] DOB: [birth year] Today's Date: 11/25/2013    History of Present Illness 63 y.o. female s/p left total hip arthroplasty.  Hx of lupus, GERD, and thyroid disease.   Clinical Impression   Pta pt was required assistance for LB dressing due to pain and now presents with generalized weakness and acute pain interfering with her independence with ADLs. Educated pt on LB bathing/dressing sequence and the multiple uses of the 3in1. Pt would benefit from additional acute OT to practice a shower transfer to a shower seat and to practice LB dressing using AE prior to D/C home with supervision. Will continue to follow.    Follow Up Recommendations  No OT follow up;Supervision/Assistance - 24 hour    Equipment Recommendations  3 in 1 bedside comode       Precautions / Restrictions Precautions Precautions: None Precaution Comments: Direct anterior approach - no precautions Restrictions LLE Weight Bearing: Weight bearing as tolerated      Mobility Bed Mobility               General bed mobility comments: Pt up in chair upon OT tx.  Transfers Overall transfer level: Needs assistance Equipment used: Rolling walker (2 wheeled) Transfers: Sit to/from Stand Sit to Stand: Supervision         General transfer comment: VC for hand placement and to not "plop" down onto chair    Balance Overall balance assessment: Needs assistance Sitting-balance support: No upper extremity supported;Feet supported Sitting balance-Leahy Scale: Good     Standing balance support: No upper extremity supported Standing balance-Leahy Scale: Fair                              ADL Overall ADL's : Needs assistance/impaired Eating/Feeding: Independent;Sitting   Grooming: Wash/dry hands;Supervision/safety;Standing   Upper Body Bathing: Set up;Sitting   Lower Body Bathing: Minimal assistance;With adaptive  equipment;Sit to/from stand   Upper Body Dressing : Set up;Sitting   Lower Body Dressing: Minimal assistance;With adaptive equipment;Sit to/from stand   Toilet Transfer: Supervision/safety;Ambulation;RW;BSC   Toileting- Architect and Hygiene: Supervision/safety;Sit to/from stand       Functional mobility during ADLs: Supervision/safety;Rolling walker General ADL Comments: Pta pt needed help with LLE dressing due to increased pain and decreased ROM. Educated pt on LB ADL sequence/technique, AE, shower transfer technique, the many uses of the 3in1 and proper shower transfer technique.                Pertinent Vitals/Pain No c/o pain during tx, pt was premedicated.     Hand Dominance Right   Extremity/Trunk Assessment Upper Extremity Assessment Upper Extremity Assessment: Overall WFL for tasks assessed   Lower Extremity Assessment Lower Extremity Assessment: Defer to PT evaluation       Communication Communication Communication: No difficulties   Cognition Arousal/Alertness: Awake/alert Behavior During Therapy: WFL for tasks assessed/performed Overall Cognitive Status: Within Functional Limits for tasks assessed                                Home Living Family/patient expects to be discharged to:: Private residence Living Arrangements: Spouse/significant other;Parent Available Help at Discharge: Family;Available 24 hours/day Type of Home: Mobile home Home Access: Ramped entrance     Home Layout: One level     Bathroom Shower/Tub: Walk-in shower;Door   [employer]: Standard  Home Equipment: Gilmer MorCane - single point;Shower seat;Hand held shower head;Adaptive equipment Adaptive Equipment: Reacher        Prior Functioning/Environment Level of Independence: Needs assistance    ADL's / Homemaking Assistance Needed: husband has helped with LLE dressing recently   Comments: Used cane for ambulation    OT Diagnosis: Generalized  weakness;Acute pain   OT Problem List: Decreased strength;Decreased range of motion;Decreased activity tolerance;Decreased knowledge of use of DME or AE;Impaired balance (sitting and/or standing);Pain   OT Treatment/Interventions: Self-care/ADL training;Patient/family education;Energy conservation;DME and/or AE instruction;Balance training    OT Goals(Current goals can be found in the care plan section) Acute Rehab OT Goals Patient Stated Goal: Go home - retire in december OT Goal Formulation: With patient Time For Goal Achievement: 12/02/13 Potential to Achieve Goals: Good ADL Goals Pt Will Perform Lower Body Bathing: with supervision;with adaptive equipment;sit to/from stand Pt Will Perform Lower Body Dressing: with supervision;with adaptive equipment;sit to/from stand Pt Will Perform Tub/Shower Transfer: Shower transfer;with min guard assist;ambulating;shower seat;rolling walker  OT Frequency: Min 2X/week              End of Session Equipment Utilized During Treatment: Rolling walker  Activity Tolerance: Patient tolerated treatment well Patient left: in chair;with call bell/phone within reach;with family/visitor present   Time:  -    Charges:    G-CodesMaurene Small:    [name] Small 11/25/2013, 12:48 PM

## 2013-11-26 LAB — CBC
HEMATOCRIT: 27.4 % — AB (ref 36.0–46.0)
Hemoglobin: 8.9 g/dL — ABNORMAL LOW (ref 12.0–15.0)
MCH: 29.6 pg (ref 26.0–34.0)
MCHC: 32.5 g/dL (ref 30.0–36.0)
MCV: 91 fL (ref 78.0–100.0)
PLATELETS: 147 10*3/uL — AB (ref 150–400)
RBC: 3.01 MIL/uL — AB (ref 3.87–5.11)
RDW: 13.3 % (ref 11.5–15.5)
WBC: 12.8 10*3/uL — ABNORMAL HIGH (ref 4.0–10.5)

## 2013-11-26 MED ORDER — ASPIRIN EC 325 MG PO TBEC: 325.0000 mg | DELAYED_RELEASE_TABLET | Freq: Two times a day (BID) | ORAL | Status: AC

## 2013-11-26 MED ORDER — DOXYCYCLINE HYCLATE 100 MG PO TABS: 100.0000 mg | ORAL_TABLET | Freq: Two times a day (BID) | ORAL | Status: AC

## 2013-11-26 MED ORDER — OXYCODONE-ACETAMINOPHEN 5-325 MG PO TABS: 1.0000 | ORAL_TABLET | ORAL | Status: AC | PRN

## 2013-11-26 MED ORDER — FERROUS SULFATE 325 (65 FE) MG PO TABS: 325.0000 mg | ORAL_TABLET | Freq: Three times a day (TID) | ORAL | Status: AC

## 2013-11-26 MED ORDER — OXYCODONE HCL 5 MG PO TABS: 5.0000 mg | ORAL_TABLET | ORAL | Status: AC | PRN

## 2013-11-26 MED ORDER — DSS 100 MG PO CAPS: 100.0000 mg | ORAL_CAPSULE | Freq: Two times a day (BID) | ORAL | Status: AC

## 2013-11-26 MED ORDER — METHOCARBAMOL 500 MG PO TABS: 500.0000 mg | ORAL_TABLET | Freq: Four times a day (QID) | ORAL | Status: AC | PRN

## 2013-11-26 NOTE — Care Management Note (Signed)
   11/26/13 1:42pm Vance PeperSusan Sriram Febles, RN BSN Case Manager Case manager contacted Dr.Xu concerning patient's desire to go home. Patient doing better with therapy. Patient and husband found HH agency near them, Case manager has attempted to reach them but it's saturday. Md is agreeable with patient being discharged home on 11/27/13 and with case manager arranging home health on Monday. DME has been ordered. This informatin has been explained to patient and bedside nurse.

## 2013-11-26 NOTE — Discharge Instructions (Signed)
Increase your activities as comfort allows. Full weight as tolerated left hip; no hip precautions. You can get your current dressing wet in the shower. Leave your current dressing in place until your follow-up appointment.

## 2013-11-26 NOTE — Progress Notes (Signed)
Physical Therapy Treatment Patient Details Name: Arleny Kruger MRN: [id number] DOB: October 13, 1950 Today's Date: 11/26/2013    History of Present Illness 63 y.o. female s/p left total hip arthroplasty.  Hx of lupus, GERD, and thyroid disease.    PT Comments    Patient is progressing well towards physical therapy goals, ambulating up to 75 feet while using rolling walker with min guard for safety. Continues to have considerable amount of difficulty with bed mobility but slowly improving. Tolerating and willing to perform therapeutic exercises as she is very motivated to improve. Patient will continue to benefit from skilled physical therapy services to further improve independence with functional mobility.    Follow Up Recommendations  Supervision for mobility/OOB;SNF     Equipment Recommendations  Rolling walker with 5" wheels;3in1 (PT)    Recommendations for Other Services       Precautions / Restrictions Precautions Precautions: None Precaution Comments: Direct anterior approach - no precautions Restrictions Weight Bearing Restrictions: Yes LLE Weight Bearing: Weight bearing as tolerated    Mobility  Bed Mobility Overal bed mobility: Needs Assistance Bed Mobility: Supine to Sit     Supine to sit: Min guard     General bed mobility comments: Progressed to min guard, however requires considerable effort for pt to perform. Heavy use of bed rail and takes extra time. VC for technique. Pt did not wish for any assistance.  Transfers Overall transfer level: Needs assistance Equipment used: Rolling walker (2 wheeled) Transfers: Sit to/from Stand Sit to Stand: Min guard         General transfer comment: Min guard for safety with VC for hand placement and walker placement prior to sitting. Performed from lowest bed setting, BSC and recliner x2.  Ambulation/Gait Ambulation/Gait assistance: Min guard Ambulation Distance (Feet): 75 Feet Assistive device: Rolling walker (2  wheeled) Gait Pattern/deviations: Step-to pattern;Decreased step length - right;Decreased stance time - left;Antalgic;Trunk flexed   Gait velocity interpretation: Below normal speed for age/gender General Gait Details: VC for upright posture with instructions for glute activation during left stance phase. No loss of balance during bout. Moderately antalgic. Slowly progressing left swing through advancement of gait cycle but with difficulty due to hip flexion weakness.  Slowly emerging step-through gait pattern with intermittent cues.   Stairs            Wheelchair Mobility    Modified Rankin (Stroke Patients Only)       Balance                                    Cognition Arousal/Alertness: Awake/alert Behavior During Therapy: WFL for tasks assessed/performed Overall Cognitive Status: Within Functional Limits for tasks assessed                      Exercises Total Joint Exercises Ankle Circles/Pumps: AROM;Both;10 reps;Seated Gluteal Sets: Strengthening;Both;10 reps;Seated Heel Slides: AAROM;Left;10 reps;Supine Long Arc Quad: Strengthening;Both;10 reps;Seated Marching in Standing: AROM;Both;5 reps;Standing General Exercises - Lower Extremity Mini-Sqauts: Strengthening;Both;10 reps;Standing    General Comments General comments (skin integrity, edema, etc.): Rash upon bilateral arms which began late this AM. States nurse is aware.      Pertinent Vitals/Pain Pt reports pain 4/10 Patient repositioned in chair for comfort.     Home Living                      Prior Function  PT Goals (current goals can now be found in the care plan section) Acute Rehab PT Goals PT Goal Formulation: With patient Time For Goal Achievement: 12/02/13 Potential to Achieve Goals: Good Progress towards PT goals: Progressing toward goals    Frequency  7X/week    PT Plan Current plan remains appropriate    Co-evaluation              End of Session   Activity Tolerance: Patient tolerated treatment well Patient left: in chair;with call bell/phone within reach;with family/visitor present     Time: 1231-1300 PT Time Calculation (min): 29 min  Charges:  $Gait Training: 8-22 mins $Therapeutic Exercise: 8-22 mins                    G Codes:      [employer] Gershom [place], [place] [phone]  Berton MountBarbour, Tywanna Seifer S 11/26/2013, 2:23 PM

## 2013-11-26 NOTE — Progress Notes (Signed)
Occupational Therapy Treatment Patient Details Name: Mirren Gest MRN: [id number] DOB: [birth year] Today's Date: 11/26/2013    History of present illness 63 y.o. female s/p left total hip arthroplasty.  Hx of lupus, GERD, and thyroid disease.   OT comments  Educated on shower transfer technique as well as AE for LB ADLs. Spouse present for session.  Follow Up Recommendations  Home health OT;Supervision - Intermittent    Equipment Recommendations  3 in 1 bedside comode;Other (comment) (AE)    Recommendations for Other Services      Precautions / Restrictions Precautions Precautions: None Precaution Comments: Direct anterior approach - no precautions Restrictions Weight Bearing Restrictions: Yes LLE Weight Bearing: Weight bearing as tolerated       Mobility Bed Mobility Overal bed mobility: Needs Assistance Bed Mobility: Sit to Supine       Sit to supine: Min assist   General bed mobility comments: assistance with LLE. Had pt practice with sheet to help lift LLE.  Transfers Overall transfer level: Needs assistance Equipment used: Rolling walker (2 wheeled) Transfers: Sit to/from Stand Sit to Stand: Supervision           Balance                                   ADL Overall ADL's : Needs assistance/impaired                     Lower Body Dressing: Set up;Supervision/safety;Sit to/from stand;With adaptive equipment   Toilet Transfer: Supervision/safety;Ambulation;RW;Stand-pivot (chair to bed)           Functional mobility during ADLs: Supervision/safety;Rolling walker General ADL Comments: Educated on AE for LB ADLs and dressing technique. Educated on safety tips for home (use of bag on walker, safe shoewear). Educated on shower transfer techniques and recommended pt wait for HH to go over with her.      Vision                     Perception     Praxis      Cognition   Behavior During Therapy: [facility] for tasks  assessed/performed Overall Cognitive Status: Within Functional Limits for tasks assessed                       Extremity/Trunk Assessment                  Shoulder Instructions       General Comments      Pertinent Vitals/ Pain       Pain 7/10. Repositioned.   Home Living                                          Prior Functioning/Environment              Frequency Min 2X/week     Progress Toward Goals  OT Goals(current goals can now be found in the care plan section)  Progress towards OT goals: Progressing toward goals  Acute Rehab OT Goals Patient Stated Goal: not stated OT Goal Formulation: With patient Time For Goal Achievement: 12/02/13 Potential to Achieve Goals: Good ADL Goals Pt Will Perform Lower Body Bathing: with supervision;with adaptive equipment;sit to/from stand Pt Will Perform Lower Body Dressing: with supervision;with adaptive equipment;sit to/from  stand Pt Will Perform Tub/Shower Transfer: Shower transfer;with min guard assist;ambulating;shower seat;rolling walker  Plan Discharge plan needs to be updated    Co-evaluation                 End of Session Equipment Utilized During Treatment: Rolling walker   Activity Tolerance Patient tolerated treatment well   Patient Left in bed;with family/visitor present   Nurse Communication          Time: 6440-34741714-1731 OT Time Calculation (min): 17 min  Charges: OT General Charges $OT Visit: 1 Procedure OT Treatments $Self Care/Home Management : 8-22 mins  Earlie RavelingStraub, Yessica Putnam L OTR/L [phone] 11/26/2013, 6:14 PM

## 2013-11-26 NOTE — Progress Notes (Signed)
Subjective: 2 Days Post-Op Procedure(s) (LRB): LEFT TOTAL HIP ARTHROPLASTY ANTERIOR APPROACH (Left) Patient reports pain as moderate. No events.  Objective: Vital signs in last 24 hours: Temp:  [98.7 F (37.1 C)-102 F (38.9 C)] 99.1 F (37.3 C) (07/25 0517) Pulse Rate:  [77-85] 78 (07/25 0517) Resp:  [16-18] 18 (07/25 0517) BP: (114-138)/(46-75) 124/75 mmHg (07/25 0517) SpO2:  [98 %-99 %] 98 % (07/25 0517)  Intake/Output from previous day: 07/24 0701 - 07/25 0700 In: 790 [P.O.:790] Out: -  Intake/Output this shift:     Recent Labs  11/25/13 0600 11/26/13 0446  HGB 9.4* 8.9*    Recent Labs  11/25/13 0600 11/26/13 0446  WBC 15.1* 12.8*  RBC 3.19* 3.01*  HCT 28.7* 27.4*  PLT 190 147*    Recent Labs  11/25/13 0600  NA 137  K 3.9  CL 101  CO2 25  BUN 11  CREATININE 0.77  GLUCOSE 128*  CALCIUM 8.3*   No results found for this basename: LABPT, INR,  in the last 72 hours  Sensation intact distally Intact pulses distally Dorsiflexion/Plantar flexion intact Incision: scant drainage  Assessment/Plan: 2 Days Post-Op Procedure(s) (LRB): LEFT TOTAL HIP ARTHROPLASTY ANTERIOR APPROACH (Left) Up with therapy Patient likely needs another day of PT and probable dc Sunday  [name], Jestine Bicknell Michael 11/26/2013, 7:41 AM

## 2013-11-26 NOTE — Progress Notes (Signed)
Physical Therapy Treatment Patient Details Name: [name] Small MRN: [id number] DOB: Oct 03, 1950 Today's Date: 11/26/2013    History of Present Illness 63 y.o. female s/p left total hip arthroplasty.  Hx of lupus, GERD, and thyroid disease.    PT Comments    Patient continues to progress towards physical therapy goals. Ambulates up to 75 feet with additional shorter bout of 50 feet after a seated rest break; Min guard for safety while demonstrating good control of rolling walker. Reports compliance with therapeutic exercises and is motivated to improve. Patient will continue to benefit from skilled physical therapy services to further improve independence with functional mobility.   Follow Up Recommendations  Supervision for mobility/OOB;SNF     Equipment Recommendations  Rolling walker with 5" wheels;3in1 (PT)    Recommendations for Other Services       Precautions / Restrictions Precautions Precautions: None Precaution Comments: Direct anterior approach - no precautions Restrictions Weight Bearing Restrictions: Yes LLE Weight Bearing: Weight bearing as tolerated    Mobility  Bed Mobility Overal bed mobility: Needs Assistance Bed Mobility: Supine to Sit     Supine to sit: Min guard     General bed mobility comments: Progressed to min guard, however requires considerable effort for pt to perform. Heavy use of bed rail and takes extra time. VC for technique. Pt did not wish for any assistance.  Transfers Overall transfer level: Needs assistance Equipment used: Rolling walker (2 wheeled) Transfers: Sit to/from Stand Sit to Stand: Min guard         General transfer comment: Min guard for safety and continues to need VC for hand placement and walker placement prior to sitting (at [facility]). Performed from lowest bed setting, BSC and recliner x3.  Ambulation/Gait Ambulation/Gait assistance: Min guard Ambulation Distance (Feet): 75 Feet (additional bout of 50  feet) Assistive device: Rolling walker (2 wheeled) Gait Pattern/deviations: Step-through pattern;Step-to pattern;Decreased step length - right;Decreased stance time - left;Antalgic;Trunk flexed   Gait velocity interpretation: Below normal speed for age/gender General Gait Details: Focused on instruction for step-through gait pattern with intermittent VC for sequencing and upright posture. Moves very slow and guarded, but demonstrates good walker control.   Stairs            Wheelchair Mobility    Modified Rankin (Stroke Patients Only)       Balance                                    Cognition Arousal/Alertness: Awake/alert Behavior During Therapy: WFL for tasks assessed/performed Overall Cognitive Status: Within Functional Limits for tasks assessed                      Exercises Total Joint Exercises Ankle Circles/Pumps: AROM;Both;10 reps;Seated Quad Sets: Strengthening;Both;10 reps;Seated Gluteal Sets: Strengthening;Both;10 reps;Seated Heel Slides: AAROM;Left;10 reps;Supine Straight Leg Raises: AAROM;Strengthening;Left;10 reps;Seated (limited activation - requires a lot of assist) Long Arc Quad: Strengthening;10 reps;Seated;Left Marching in Standing: AROM;Both;5 reps;Standing General Exercises - Lower Extremity Mini-Sqauts: Strengthening;Both;10 reps;Standing    General Comments General comments (skin integrity, edema, etc.): Discussed d/c planning. pt reports she may have found a home health agency that is able to assist her at home with therapy but cannot confirm until monday.      Pertinent Vitals/Pain 2/10 pain at rest Patient repositioned in chair for comfort.     Home Living  Prior Function            PT Goals (current goals can now be found in the care plan section) Acute Rehab PT Goals PT Goal Formulation: With patient Time For Goal Achievement: 12/02/13 Potential to Achieve Goals:  Good Progress towards PT goals: Progressing toward goals    Frequency  7X/week    PT Plan Current plan remains appropriate    Co-evaluation             End of Session   Activity Tolerance: Patient tolerated treatment well Patient left: in chair;with call bell/phone within reach;with family/visitor present     Time: [id number] PT Time Calculation (min): 30 min  Charges:  $Gait Training: 8-22 mins $Therapeutic Exercise: 8-22 mins                    G Codes:      [employer] [facility] Manahil Vanzile, [place] [phone]  Berton MountBarbour, Montae Stager S 11/26/2013, 4:39 PM

## 2013-11-27 LAB — CBC
HCT: 26.7 % — ABNORMAL LOW (ref 36.0–46.0)
Hemoglobin: 8.9 g/dL — ABNORMAL LOW (ref 12.0–15.0)
MCH: 30.8 pg (ref 26.0–34.0)
MCHC: 33.3 g/dL (ref 30.0–36.0)
MCV: 92.4 fL (ref 78.0–100.0)
Platelets: 158 10*3/uL (ref 150–400)
RBC: 2.89 MIL/uL — ABNORMAL LOW (ref 3.87–5.11)
RDW: 13 % (ref 11.5–15.5)
WBC: 13.4 10*3/uL — ABNORMAL HIGH (ref 4.0–10.5)

## 2013-11-27 NOTE — Progress Notes (Signed)
Subjective: 3 Days Post-Op Procedure(s) (LRB): LEFT TOTAL HIP ARTHROPLASTY ANTERIOR APPROACH (Left) Did well with PT yesterday.  Objective: Vital signs in last 24 hours: Temp:  [98.4 F (36.9 C)-98.9 F (37.2 C)] 98.4 F (36.9 C) (07/26 0650) Pulse Rate:  [86-103] 88 (07/26 0650) Resp:  [16-20] 20 (07/26 0650) BP: (105-129)/(32-95) 105/32 mmHg (07/26 0650) SpO2:  [92 %-98 %] 92 % (07/26 0650)  Intake/Output from previous day:   Intake/Output this shift:     Recent Labs  11/25/13 0600 11/26/13 0446 11/27/13 0423  HGB 9.4* 8.9* 8.9*    Recent Labs  11/26/13 0446 11/27/13 0423  WBC 12.8* 13.4*  RBC 3.01* 2.89*  HCT 27.4* 26.7*  PLT 147* 158    Recent Labs  11/25/13 0600  NA 137  K 3.9  CL 101  CO2 25  BUN 11  CREATININE 0.77  GLUCOSE 128*  CALCIUM 8.3*   No results found for this basename: LABPT, INR,  in the last 72 hours  Dressing c/d/i Rash on her arms and legs  Assessment/Plan: 3 Days Post-Op Procedure(s) (LRB): LEFT TOTAL HIP ARTHROPLASTY ANTERIOR APPROACH (Left)  - patient is ready for d/c from PT standpoint, however, awaiting confirmation from SW/CM standpoint - will discharge patient home if [facility] - Rx in chart  [name] Small, [name] Small 11/27/2013, 7:17 AM

## 2013-11-27 NOTE — Progress Notes (Signed)
Clinical Social Work Department CLINICAL SOCIAL WORK PLACEMENT NOTE 11/27/2013  Patient:  [name] Small,[name] Small  Account Number:  [id number] Admit date:  11/24/2013  Clinical Social Worker:  Jetta LoutBAILEY MORGAN, Theresia MajorsLCSWA  Date/time:  11/27/2013 06:39 PM  Clinical Social Work is seeking post-discharge placement for this patient at the following level of care:   SKILLED NURSING   (*CSW will update this form in Epic as items are completed)   11/27/2013  Patient/family provided with [facility] Department of Clinical Social Work's list of facilities offering this level of care within the geographic area requested by the patient (or if unable, by the patient's family).  11/27/2013  Patient/family informed of their freedom to choose among providers that offer the needed level of care, that participate in Medicare, Medicaid or managed care program needed by the patient, have an available bed and are willing to accept the patient.  11/27/2013  Patient/family informed of [facility]' ownership interest in [facility], as well as of the fact that they are under no obligation to receive care at this facility.  PASARR submitted to EDS on 11/27/2013 PASARR number received on   FL2 transmitted to all facilities in geographic area requested by pt/family on  11/27/2013 FL2 transmitted to all facilities within larger geographic area on   Patient informed that his/her managed care company has contracts with or will negotiate with  certain facilities, including the following:     Patient/family informed of bed offers received:   Patient chooses bed at  Physician recommends and patient chooses bed at    Patient to be transferred to  on   Patient to be transferred to facility by  Patient and family notified of transfer on  Name of family member notified:    The following physician request were entered in Epic:   Additional Comments: PASARR went to manual review when submitted.

## 2013-11-27 NOTE — Progress Notes (Signed)
Physical Therapy Treatment Patient Details Name: [name] Small MRN: [id number] DOB: Oct 12, 1950 Today's Date: 11/27/2013    History of Present Illness 63 y.o. female s/p left total hip arthroplasty.  Hx of lupus, GERD, and thyroid disease.    PT Comments    Pt progressing towards physical therapy goals ambulating up to 90 feet with min guard while using rolling walker. Still unsure if patient will have home health agency capable of traveling to her home; case management and social work is following. Patient will continue to benefit from skilled physical therapy services to further improve independence with functional mobility.   Follow Up Recommendations  Supervision for mobility/OOB;SNF     Equipment Recommendations  Rolling walker with 5" wheels;3in1 (PT)    Recommendations for Other Services       Precautions / Restrictions Precautions Precautions: None Precaution Comments: Direct anterior approach - no precautions Restrictions Weight Bearing Restrictions: Yes LLE Weight Bearing: Weight bearing as tolerated    Mobility  Bed Mobility Overal bed mobility: Needs Assistance Bed Mobility: Supine to Sit;Sit to Supine     Supine to sit: Min guard Sit to supine: Min guard   General bed mobility comments: Min guard for safety. Practiced per pt request towards right side of bed. VC for technique. Educated on use of RLE to support LLE. Still considerable effort to perform task but was able to do without physical assist and no bed rail.  Transfers Overall transfer level: Needs assistance Equipment used: Rolling walker (2 wheeled) Transfers: Sit to/from Stand Sit to Stand: Min guard         General transfer comment: Min guard for safety with VC for walker placement before sitting on BSC. [facility] and [facility] bed setting x3. Good stability upon standing.  Ambulation/Gait Ambulation/Gait assistance: Min guard Ambulation Distance (Feet): 90 Feet (x2) Assistive  device: Rolling walker (2 wheeled) Gait Pattern/deviations: Step-through pattern;Decreased step length - right;Decreased stance time - left;Antalgic   Gait velocity interpretation: Below normal speed for age/gender General Gait Details: Improved posture today and advancing LLE better with swing through phase of gait. Still demonstrates weak hip flexion and no heel strike on Lt. VC for hip flexion with each Lt step. Better step-through gait pattern.   Stairs            Wheelchair Mobility    Modified Rankin (Stroke Patients Only)       Balance                                    Cognition Arousal/Alertness: Awake/alert Behavior During Therapy: WFL for tasks assessed/performed Overall Cognitive Status: Within Functional Limits for tasks assessed                      Exercises      General Comments General comments (skin integrity, edema, etc.): Rash on UEs appears to be resolving.      Pertinent Vitals/Pain Pt reports pain as low. No numerical value given Patient repositioned in chair for comfort.     Home Living                      Prior Function            PT Goals (current goals can now be found in the care plan section) Acute Rehab PT Goals PT Goal Formulation: With patient Time For Goal Achievement: 12/02/13 Potential  to Achieve Goals: Good Progress towards PT goals: Progressing toward goals    Frequency  7X/week    PT Plan Current plan remains appropriate    Co-evaluation             End of Session   Activity Tolerance: Patient tolerated treatment well Patient left: in chair;with call bell/phone within reach;with family/visitor present     Time: 1450-1518 PT Time Calculation (min): 28 min  Charges:  $Gait Training: 8-22 mins $Therapeutic Activity: 8-22 mins                    G Codes:      [employer], [place] [phone]  Berton Mount 11/27/2013, 3:25 PM

## 2013-11-27 NOTE — Progress Notes (Signed)
At 1946 and 0445 patient became dizzy and lightheaded while ambulating to and from the restroom.

## 2013-11-27 NOTE — Progress Notes (Signed)
PT Cancellation Note  Patient Details Name: [name] Small MRN: [id number] DOB: 06-29-1950   Cancelled Treatment:    Reason Eval/Treat Not Completed: Patient declined, no reason specified Patient reports nausea, requests PT session be held until later this afternoon. Nurse aware. Will follow up this afternoon.  [address] Skib[address] Secor [place], [place] [phone]   Berton MountBarbour, Sherwood Castilla S 11/27/2013, 12:27 PM

## 2013-11-27 NOTE — Progress Notes (Signed)
Clinical Social Work Department BRIEF PSYCHOSOCIAL ASSESSMENT 11/27/2013  Patient:  Bernhart,Jermiya     Account Number:  [id number]     Admit date:  11/24/2013  Clinical Social Worker:  Rolinda Roan  Date/Time:  11/27/2013 06:41 PM  Referred by:  Physician  Date Referred:  11/25/2013 Referred for  SNF Placement   Other Referral:   Interview type:  Patient Other interview type:    PSYCHOSOCIAL DATA Living Status:  HUSBAND Admitted from facility:   Level of care:   Primary support name:  Mariselda Badalamenti Primary support relationship to patient:  SPOUSE Degree of support available:   Good support at bedside.    CURRENT CONCERNS  Other Concerns:    SOCIAL WORK ASSESSMENT / PLAN Clinical Social Worker (CSW) met with patient to discuss D/C plan. Patient reported that she lives in [place] with her husband. Patient reported that she would like to go home with home health services however is agreeable to SNF in [place].   Assessment/plan status:  Psychosocial Support/Ongoing Assessment of Needs Other assessment/ plan:   Information/referral to community resources:    PATIENT'S/FAMILY'S RESPONSE TO PLAN OF CARE: Patient and husband thanked CSW for visit and assisting with placement process.

## 2013-11-28 NOTE — Progress Notes (Signed)
Patient ID: [name] [name], female   DOB: 1951-01-07, 63 y.o.   MRN: [id number] Ms. [name] [name] is doing well.  Slowly making progress with therapy each day.  Would rather go home with family support than short-term SNF.  Can go home today.  Vitals stable.  Dressing is intact.

## 2013-11-28 NOTE — Progress Notes (Signed)
Agree with SPT.    Anayelli Lai, PT [phone]  

## 2013-11-28 NOTE — Care Management Note (Signed)
11/28/13 1245p Vance PeperSusan Thena Devora, RN BSN CM CM received call from Dr. Eliberto IvoryBlackman's PA, He will enter order for outpatient physical therapy. [facility] to [facility] [phone]. They will contact patient with start date and time. CM informed patient and husband of this, provided them with contact information.

## 2013-11-28 NOTE — Care Management Note (Signed)
CARE MANAGEMENT NOTE 11/28/2013  Patient:  [name],[name]   Account Number:  [id number]  Date Initiated:  11/25/2013  Documentation initiated by:  [name] PeperBRADY,[name] Sivak  Subjective/Objective Assessment:   63 yr old female s/p left total hip arthroplasty.     Action/Plan:   [facility] CM notified Child psychotherapistsocial worker. [place]   Anticipated DC Date:  11/28/2013   Anticipated DC Plan:  HOME/SELF CARE  In-house referral  Clinical Social Worker      DC Associate Professorlanning Services  CM consult      [facility]  HOME HEALTH  DURABLE MEDICAL EQUIPMENT   Choice offered to / List presented to:  C-1 Patient   DME arranged  [facility]  3-N-1      DME agency  [facility]        Status of service:  Completed, signed off Medicare Important Message given?   (If response is "NO", the following Medicare IM given date fields will be blank) Date Medicare IM given:   Medicare IM given by:   Date Additional Medicare IM given:   Additional Medicare IM given by:    Discharge Disposition:  HOME/SELF CARE  Per UR Regulation:  Reviewed for med. necessity/level of care/duration of stay  If discussed at Long Length of Stay Meetings, dates discussed:    Comments:  11/28/13 10:00am- 11:30am [name] PeperSusan Traci Gafford, RN BSN Case manager CM has been contacting home health agencies in [place] with no success. [facility] but not until first week in August. CM contacted [facility]  [address], [place] [phone]. They provide outpatient therapy. Fax#[phone]. CM waiting for Dr. Magnus IvanBlackman to call back with permission to arrange this

## 2013-11-28 NOTE — Progress Notes (Signed)
Agree with SPT.    Kandon Hosking, PT [phone]  

## 2013-11-28 NOTE — Discharge Summary (Signed)
Patient ID: [name] [name] MRN: [id number] DOB/AGE: [birth year] 63 y.o.  Admit date: 11/24/2013 Discharge date: 11/28/2013  Admission Diagnoses:  Principal Problem:   Arthritis of left hip Active Problems:   Status post THR (total hip replacement)   Discharge Diagnoses:  Same  Past Medical History  Diagnosis Date  . PONV (postoperative nausea and vomiting)   . Thyroid disease   . Lupus (systemic lupus erythematosus)   . Cataract     premature  . Sleep apnea     PMH prior to gastric Bypass  . GERD (gastroesophageal reflux disease)   . Blood transfusion complicating pregnancy     Surgeries: Procedure(s): LEFT TOTAL HIP ARTHROPLASTY ANTERIOR APPROACH on 11/24/2013   Consultants:    Discharged Condition: Improved  Hospital Course: [name] [name] is an 63 y.o. female who was admitted 11/24/2013 for operative treatment ofArthritis of left hip. Patient has severe unremitting pain that affects sleep, daily activities, and work/hobbies. After pre-op clearance the patient was taken to the operating room on 11/24/2013 and underwent  Procedure(s): LEFT TOTAL HIP ARTHROPLASTY ANTERIOR APPROACH.    Patient was given perioperative antibiotics: Anti-infectives   Start     Dose/Rate Route Frequency Ordered Stop   11/26/13 0000  doxycycline (VIBRA-TABS) 100 MG tablet     100 mg Oral 2 times daily 11/26/13 1405     11/24/13 2200  hydroxychloroquine (PLAQUENIL) tablet 200 mg     200 mg Oral 2 times daily 11/24/13 1807     11/24/13 1815  ceFAZolin (ANCEF) IVPB 2 g/50 mL premix     2 g 100 mL/hr over 30 Minutes Intravenous Every 6 hours 11/24/13 1807 11/24/13 2355   11/24/13 0600  ceFAZolin (ANCEF) IVPB 2 g/50 mL premix     2 g 100 mL/hr over 30 Minutes Intravenous On call to O.R. 11/23/13 1449 11/24/13 1042       Patient was given sequential compression devices, early ambulation, and chemoprophylaxis to prevent DVT.  Patient benefited maximally from hospital stay and there were no  complications.    Recent vital signs: Patient Vitals for the past 24 hrs:  BP Temp Temp src Pulse Resp SpO2  11/28/13 0630 127/44 mmHg 98.3 F (36.8 C) Oral 76 - 97 %  11/28/13 0400 - - - - 18 98 %  11/28/13 0000 - - - - 18 98 %  11/27/13 2052 124/35 mmHg 98.7 F (37.1 C) Oral 78 - 96 %  11/27/13 2000 - - - - 18 96 %  11/27/13 1625 115/51 mmHg 99.8 F (37.7 C) Oral 93 18 99 %     Recent laboratory studies:  Recent Labs  11/26/13 0446 11/27/13 0423  WBC 12.8* 13.4*  HGB 8.9* 8.9*  HCT 27.4* 26.7*  PLT 147* 158     Discharge Medications:     Medication List         aspirin EC 325 MG tablet  Take 1 tablet (325 mg total) by mouth 2 (two) times daily.     cholecalciferol 1000 UNITS tablet  Commonly known as:  VITAMIN D  Take 1,000 Units by mouth 2 (two) times daily.     doxycycline 100 MG tablet  Commonly known as:  VIBRA-TABS  Take 1 tablet (100 mg total) by mouth 2 (two) times daily.     DSS 100 MG Caps  Take 100 mg by mouth 2 (two) times daily.     ferrous sulfate 325 (65 FE) MG tablet  Take 1 tablet (325 mg total) by  mouth 3 (three) times daily after meals.     gabapentin 600 MG tablet  Commonly known as:  NEURONTIN  Take 600 mg by mouth 3 (three) times daily.     hydroxychloroquine 200 MG tablet  Commonly known as:  PLAQUENIL  Take 200 mg by mouth 2 (two) times daily.     levothyroxine 150 MCG tablet  Commonly known as:  SYNTHROID, LEVOTHROID  Take 150 mcg by mouth daily before breakfast. Monday -Saturday     levothyroxine 75 MCG tablet  Commonly known as:  SYNTHROID, LEVOTHROID  Take 75 mcg by mouth daily before breakfast. On Sunday only     methocarbamol 500 MG tablet  Commonly known as:  ROBAXIN  Take 1 tablet (500 mg total) by mouth every 6 (six) hours as needed for muscle spasms.     OVER THE COUNTER MEDICATION  Take 1 tablet by mouth 2 (two) times daily. Hair and Nail Vitamin     oxyCODONE 5 MG immediate release tablet  Commonly known as:   Oxy IR/ROXICODONE  Take 1-3 tablets (5-15 mg total) by mouth every 4 (four) hours as needed.     oxyCODONE-acetaminophen 5-325 MG per tablet  Commonly known as:  ROXICET  Take 1-2 tablets by mouth every 4 (four) hours as needed for severe pain.     ranitidine 150 MG tablet  Commonly known as:  ZANTAC  Take 150 mg by mouth 2 (two) times daily.        Diagnostic Studies: Dg Hip Operative Left  11/24/2013   CLINICAL DATA:  Left total hip arthroplasty.  EXAM: OPERATIVE LEFT HIP  COMPARISON:  None.  FINDINGS: Single intraoperative fluoroscopic images of the left hip demonstrates the patient to be status post left total hip arthroplasty. The femoral and acetabular components appear to be well situated.  IMPRESSION: See above.   Electronically Signed   By: Roque Lias M.D.   On: 11/24/2013 13:28   Dg Pelvis Portable  11/24/2013   CLINICAL DATA:  Left hip replacement.  EXAM: PORTABLE PELVIS 1-2 VIEWS  COMPARISON:  None.  FINDINGS: The patient has a new left total hip arthroplasty. The device is located. No fracture is identified. Gas in the soft tissues from surgery is noted.  IMPRESSION: Status post left hip replacement without evidence of complication. No acute abnormality.   Electronically Signed   By: Drusilla Kanner M.D.   On: 11/24/2013 14:50   Dg Hip Portable 1 View Left  11/24/2013   CLINICAL DATA:  Left hip replacement.  EXAM: PORTABLE LEFT HIP - 1 VIEW  COMPARISON:  None.  FINDINGS: The study is limited by portable technique in the patient's habitus. The patient has a new left total hip arthroplasty. The device is located. No fracture is identified.  IMPRESSION: Left hip replacement without evidence complication.   Electronically Signed   By: Drusilla Kanner M.D.   On: 11/24/2013 14:51    Disposition: to home      Discharge Instructions   Call MD / Call 911    Complete by:  As directed   If you experience chest pain or shortness of breath, CALL 911 and be transported to the  hospital emergency room.  If you develope a fever above 101.5 F, pus (white drainage) or increased drainage or redness at the wound, or calf pain, call your surgeon's office.     Constipation Prevention    Complete by:  As directed   Drink plenty of fluids.  Prune juice  may be helpful.  You may use a stool softener, such as Colace (over the counter) 100 mg twice a day.  Use MiraLax (over the counter) for constipation as needed.     Diet - low sodium heart healthy    Complete by:  As directed      Diet general    Complete by:  As directed      Discharge patient    Complete by:  As directed      Driving restrictions    Complete by:  As directed   No driving while taking narcotic pain meds.     Follow the hip precautions as taught in Physical Therapy    Complete by:  As directed      Increase activity slowly as tolerated    Complete by:  As directed      TED hose    Complete by:  As directed   Use stockings (TED hose) for 6 weeks on both leg(s).  You may remove them at night for sleeping.     Weight bearing as tolerated    Complete by:  As directed            Follow-up Information   Follow up with Kathryne Hitch, MD In 2 weeks.   Specialty:  Orthopedic Surgery   Contact information:   [address] [place] [address] [phone]        Signed: Kathryne Hitch 11/28/2013, 7:09 AM

## 2013-11-28 NOTE — Progress Notes (Signed)
Physical Therapy Treatment Note    11/28/13 1400  PT Visit Information  Last PT Received On 11/28/13  Assistance Needed +1  History of Present Illness 63 y.o. female s/p left total hip arthroplasty.  Hx of lupus, GERD, and thyroid disease.  PT Time Calculation  PT Start Time 1113  PT Stop Time 1128  PT Time Calculation (min) 15 min  Subjective Data  Patient Stated Goal To go home  Precautions  Precautions None  Precaution Comments Direct anterior approach - no precautions  Restrictions  Weight Bearing Restrictions No  LLE Weight Bearing WBAT  Cognition  Arousal/Alertness Awake/alert  Behavior During Therapy WFL for tasks assessed/performed  Overall Cognitive Status Within Functional Limits for tasks assessed  Transfers  Overall transfer level Needs assistance  Equipment used Rolling walker (2 wheeled)  Transfers Sit to/from Stand  Sit to Stand Min guard  General transfer comment Min guard for patient safety.  Ambulation/Gait  Ambulation/Gait assistance Min guard;Supervision  Ambulation Distance (Feet) 8 Feet  Assistive device Rolling walker (2 wheeled)  Gait Pattern/deviations Step-through pattern;Decreased step length - right;Decreased stance time - left  Gait velocity interpretation Below normal speed for age/gender  General Gait Details Good posture today. Pt using walker properly without VCs.  Stairs Yes  Stairs assistance Min assist  Stair Management No rails;Backwards;With walker  Number of Stairs 2  General stair comments Educated on using the steps with RW and min assist for someone to stabilize the walker. Pt has 2 steps to enter tub however determined it would be easier to step over the entrance to the shower rather than using the steps.  Balance  Overall balance assessment Needs assistance  Sitting-balance support No upper extremity supported;Feet supported  Sitting balance-Leahy Scale Good  Standing balance support No upper extremity supported  Standing  balance-Leahy Scale Fair  PT - End of Session  Equipment Utilized During Treatment Gait belt  Activity Tolerance Patient tolerated treatment well  Patient left in chair;with call bell/phone within reach;with family/visitor present  Nurse Communication Mobility status  PT - Assessment/Plan  PT Plan Current plan remains appropriate  PT Frequency 7X/week  Follow Up Recommendations Supervision for mobility/OOB;Home health PT  PT equipment Rolling walker with 5" wheels;3in1 (PT)  PT Goal Progression  Progress towards PT goals Progressing toward goals  Acute Rehab PT Goals  PT Goal Formulation With patient  Time For Goal Achievement 12/02/13  Potential to Achieve Goals Good   Mardi MainlandMackenzie Hildegard Hlavac, SPT [phone]

## 2013-11-28 NOTE — Progress Notes (Signed)
Physical Therapy Treatment Patient Details Name: [name] SmilingColette [name] MRN: [id number] DOB: October 20, 1950 Today's Date: 11/28/2013    History of Present Illness 63 y.o. female s/p left total hip arthroplasty.  Hx of lupus, GERD, and thyroid disease.    PT Comments    Pt mobilizing 150' with use of RW and min guard/ supervision for pt safety. Pt has supervision of spouse upon discharge and would benefit from HHPT to increase strength and independence.  Follow Up Recommendations  Supervision for mobility/OOB;Home health PT     Equipment Recommendations  Rolling walker with 5" wheels;3in1 (PT)       Precautions / Restrictions Precautions Precautions: None Precaution Comments: Direct anterior approach - no precautions Restrictions Weight Bearing Restrictions: No LLE Weight Bearing: Weight bearing as tolerated    Mobility   Transfers Overall transfer level: Needs assistance Equipment used: Rolling walker (2 wheeled) Transfers: Sit to/from Stand Sit to Stand: Min guard         General transfer comment: Min guard for patient safety.  Ambulation/Gait Ambulation/Gait assistance: Min guard;Supervision Ambulation Distance (Feet): 150 Feet Assistive device: Rolling walker (2 wheeled) Gait Pattern/deviations: Step-through pattern;Decreased step length - right;Decreased stance time - left   Gait velocity interpretation: Below normal speed for age/gender General Gait Details: Good posture today. Pt using walker properly without VCs.        Balance Overall balance assessment: Needs assistance Sitting-balance support: No upper extremity supported;Feet supported Sitting balance-Leahy Scale: Good     Standing balance support: No upper extremity supported Standing balance-Leahy Scale: Fair                      Cognition Arousal/Alertness: Awake/alert Behavior During Therapy: WFL for tasks assessed/performed Overall Cognitive Status: Within Functional Limits for tasks  assessed                      Exercises Total Joint Exercises Ankle Circles/Pumps: AROM;Both;20 reps;Seated Gluteal Sets: Strengthening;Both;10 reps;Seated Long Arc Quad: Strengthening;10 reps;Seated;Left General Exercises - Lower Extremity Mini-Sqauts: Strengthening;Both;10 reps;Standing        Pertinent Vitals/Pain See vitals flow sheet.     Home Living Family/patient expects to be discharged to:: Private residence Living Arrangements: Spouse/significant other;Parent                      PT Goals (current goals can now be found in the care plan section) Acute Rehab PT Goals Patient Stated Goal: To go home PT Goal Formulation: With patient Time For Goal Achievement: 12/02/13 Potential to Achieve Goals: Good Progress towards PT goals: Progressing toward goals    Frequency  7X/week    PT Plan Current plan remains appropriate       End of Session Equipment Utilized During Treatment: Gait belt Activity Tolerance: Patient tolerated treatment well Patient left: in chair;with call bell/phone within reach;with family/visitor present     Time: [id number] PT Time Calculation (min): 27 min  Charges:                       G Codes:      Mardi MainlandMackenzie Mohamad Bruso, SPT [phone]

## 2013-12-01 NOTE — Addendum Note (Signed)
Addendum created 12/01/13 [id number] by Achille RichAdam Yuritzy Zehring, MD   Modules edited: Anesthesia Responsible Staff

## 2013-12-07 NOTE — Clinical Social Work Placement (Signed)
     Clinical Social Work Department CLINICAL SOCIAL WORK PLACEMENT NOTE 12/05/2013  Patient:  [name] Small,[name] Small  Account Number:  [id number] Admit date:  11/24/2013  Clinical Social Worker:  Jetta LoutBAILEY MORGAN, Theresia MajorsLCSWA  Date/time:  11/27/2013 06:39 PM  Clinical Social Work is seeking post-discharge placement for this patient at the following level of care:   SKILLED NURSING   (*CSW will update this form in Epic as items are completed)   11/27/2013  Patient/family provided with [facility] Department of Clinical Social Works list of facilities offering this level of care within the geographic area requested by the patient (or if unable, by the patients family).  11/27/2013  Patient/family informed of their freedom to choose among providers that offer the needed level of care, that participate in Medicare, Medicaid or managed care program needed by the patient, have an available bed and are willing to accept the patient.  11/27/2013  Patient/family informed of [facility] ownership interest in [facility], as well as of the fact that they are under no obligation to receive care at this facility.  PASARR submitted to EDS on 11/27/2013 PASARR number received on   FL2 transmitted to all facilities in geographic area requested by pt/family on  11/27/2013 FL2 transmitted to all facilities within larger geographic area on   Patient informed that his/her managed care company has contracts with or will negotiate with  certain facilities, including the following:     Patient/family informed of bed offers received:   Patient chooses bed at  Physician recommends and patient chooses bed at    Patient to be transferred to  on   Patient to be transferred to facility by  Patient and family notified of transfer on  Name of family member notified:    The following physician request were entered in Epic:   Additional Comments: PASARR went to manual review when submitted. Patient appears to  have d/c'd hom 11/28/13 with outpatient PT rather than seek SNF placement.  This CSW was on vacation on date of d/c; this note is based on RNCM noted. CSW signing off.  [name] Frederickonna T. Anina Schnake, LCSW  [phone]

## 2015-04-13 IMAGING — CR DG PORTABLE PELVIS
2 series · 2 of 2 positions shown · non-contrast
Comparison: None.

CLINICAL DATA: Left hip replacement.

EXAM:
PORTABLE PELVIS 1-2 VIEWS

[ap]
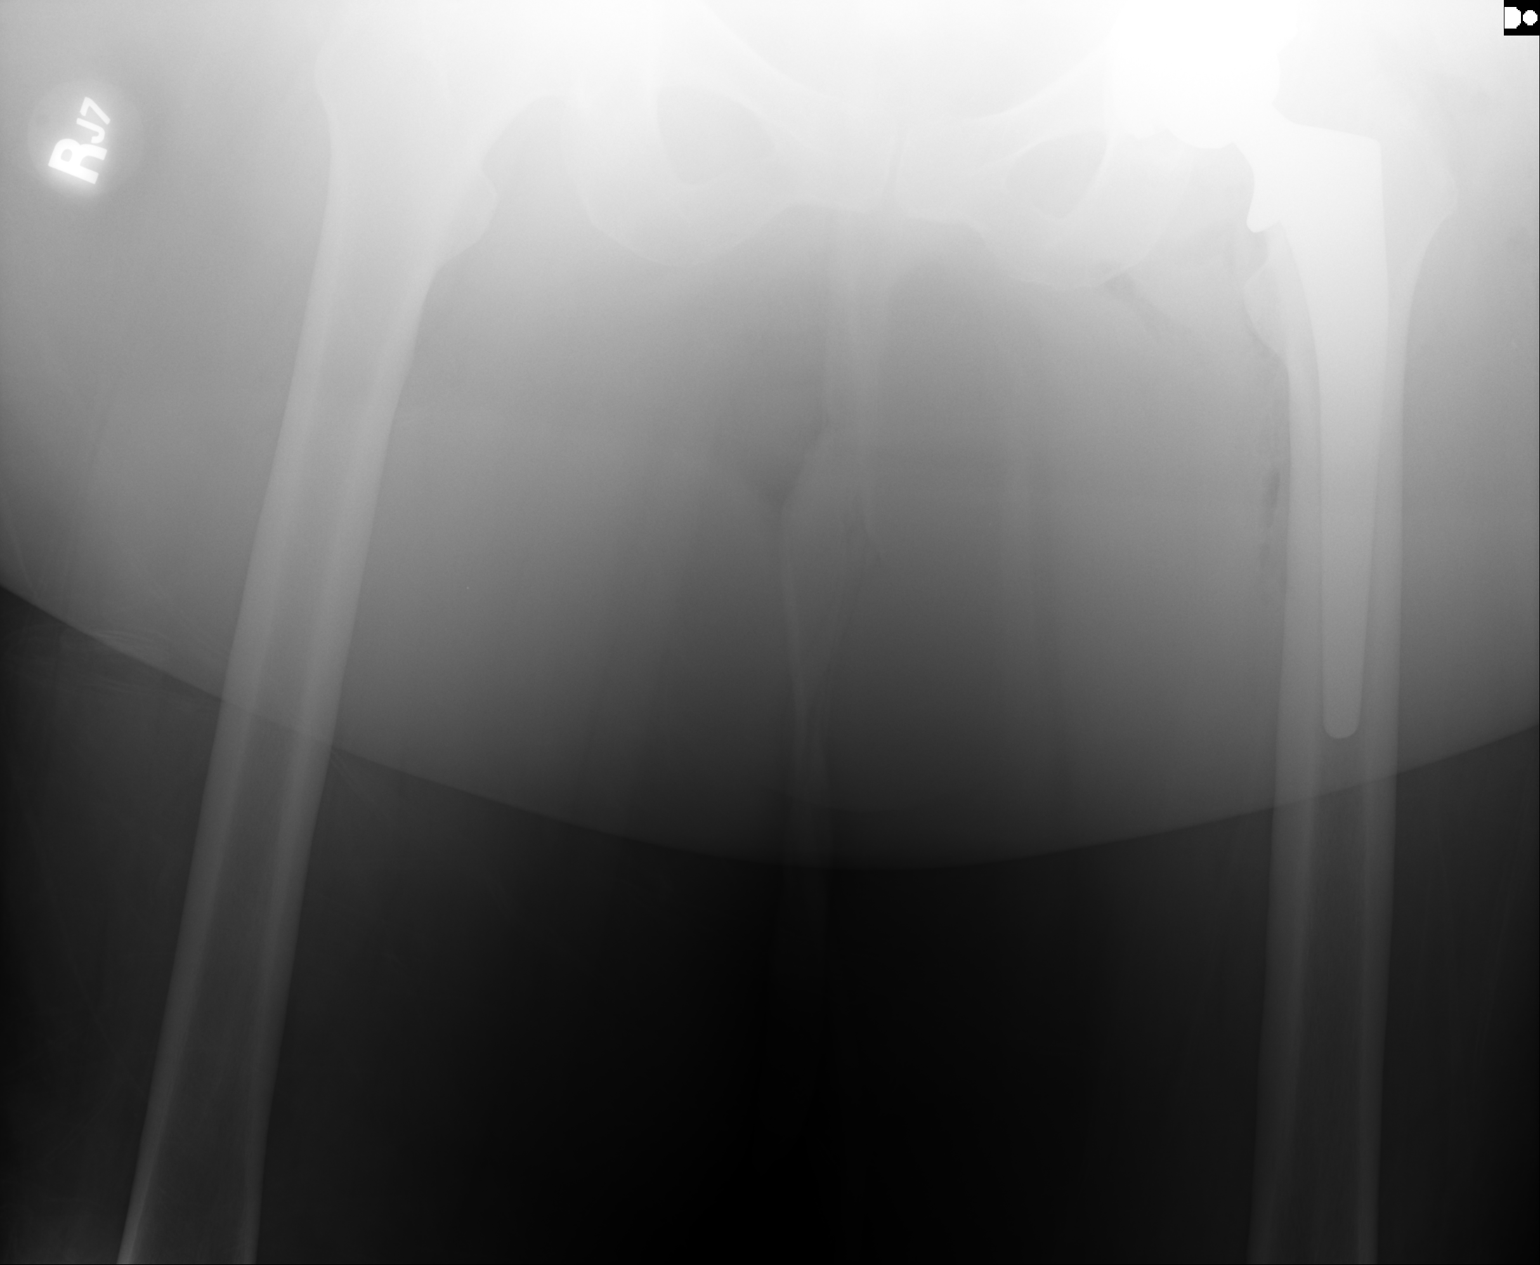

[inlet]
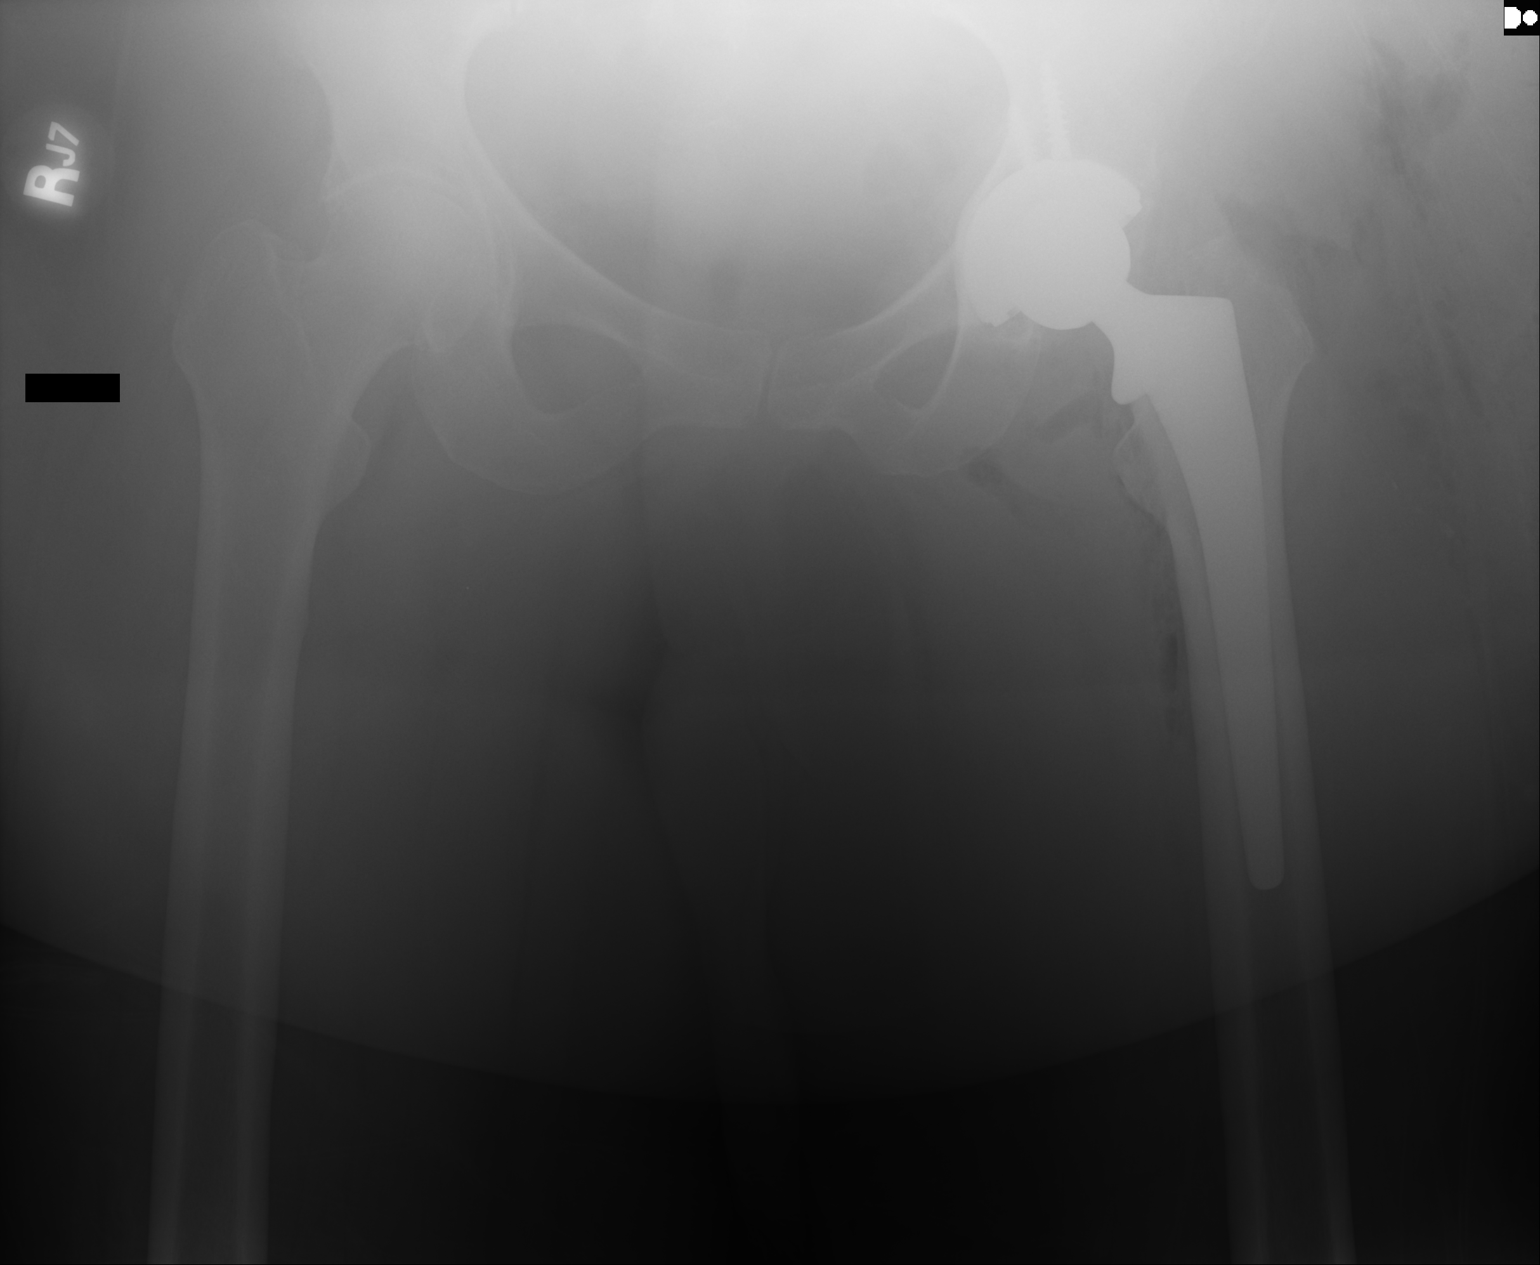

[2 of 2 positions shown; findings below may reference images not displayed]

FINDINGS: The patient has a new left total hip arthroplasty. The device is
located. No fracture is identified. Gas in the soft tissues from
surgery is noted.
IMPRESSION: Status post left hip replacement without evidence of complication.
No acute abnormality.
